# Patient Record
Sex: Male | Born: 1935
Health system: Southern US, Community
[De-identification: ages and names within clinical notes are randomized; demographics above are authoritative.]

## PROBLEM LIST (undated history)

## (undated) DIAGNOSIS — R918 Other nonspecific abnormal finding of lung field: Secondary | ICD-10-CM

## (undated) DIAGNOSIS — E785 Hyperlipidemia, unspecified: Secondary | ICD-10-CM

## (undated) DIAGNOSIS — R739 Hyperglycemia, unspecified: Secondary | ICD-10-CM

## (undated) DIAGNOSIS — K648 Other hemorrhoids: Secondary | ICD-10-CM

## (undated) DIAGNOSIS — E7800 Pure hypercholesterolemia, unspecified: Secondary | ICD-10-CM

## (undated) DIAGNOSIS — I499 Cardiac arrhythmia, unspecified: Secondary | ICD-10-CM

## (undated) DIAGNOSIS — D179 Benign lipomatous neoplasm, unspecified: Secondary | ICD-10-CM

## (undated) DIAGNOSIS — R972 Elevated prostate specific antigen [PSA]: Secondary | ICD-10-CM

## (undated) DIAGNOSIS — I10 Essential (primary) hypertension: Secondary | ICD-10-CM

## (undated) DIAGNOSIS — K635 Polyp of colon: Secondary | ICD-10-CM

## (undated) DIAGNOSIS — R7309 Other abnormal glucose: Secondary | ICD-10-CM

## (undated) DIAGNOSIS — F419 Anxiety disorder, unspecified: Secondary | ICD-10-CM

## (undated) DIAGNOSIS — N400 Enlarged prostate without lower urinary tract symptoms: Secondary | ICD-10-CM

## (undated) HISTORY — DX: Polyp of colon: K63.5

## (undated) HISTORY — DX: Anxiety disorder, unspecified: F41.9

## (undated) HISTORY — DX: Hyperglycemia, unspecified: R73.9

## (undated) HISTORY — DX: Other abnormal glucose: R73.09

## (undated) HISTORY — DX: Essential (primary) hypertension: I10

## (undated) HISTORY — DX: Benign prostatic hyperplasia without lower urinary tract symptoms: N40.0

## (undated) HISTORY — DX: Pure hypercholesterolemia, unspecified: E78.00

## (undated) HISTORY — DX: Other nonspecific abnormal finding of lung field: R91.8

## (undated) HISTORY — DX: Benign lipomatous neoplasm, unspecified: D17.9

## (undated) HISTORY — DX: Hyperlipidemia, unspecified: E78.5

## (undated) HISTORY — DX: Other hemorrhoids: K64.8

## (undated) HISTORY — PX: OTHER SURGICAL HISTORY: SHX169

## (undated) HISTORY — DX: Cardiac arrhythmia, unspecified: I49.9

## (undated) HISTORY — DX: Elevated prostate specific antigen (PSA): R97.20

---

## 2002-05-01 ENCOUNTER — Encounter (INDEPENDENT_AMBULATORY_CARE_PROVIDER_SITE_OTHER): Payer: Self-pay | Admitting: *Deleted

## 2002-05-01 ENCOUNTER — Ambulatory Visit (HOSPITAL_COMMUNITY): Admission: RE | Admit: 2002-05-01 | Discharge: 2002-05-01 | Payer: Self-pay | Admitting: *Deleted

## 2004-10-20 ENCOUNTER — Encounter (INDEPENDENT_AMBULATORY_CARE_PROVIDER_SITE_OTHER): Payer: Self-pay | Admitting: *Deleted

## 2004-10-20 ENCOUNTER — Ambulatory Visit (HOSPITAL_COMMUNITY): Admission: RE | Admit: 2004-10-20 | Discharge: 2004-10-20 | Payer: Self-pay | Admitting: *Deleted

## 2007-10-24 ENCOUNTER — Encounter: Admission: RE | Admit: 2007-10-24 | Discharge: 2007-10-24 | Payer: Self-pay | Admitting: Internal Medicine

## 2007-11-01 ENCOUNTER — Encounter: Admission: RE | Admit: 2007-11-01 | Discharge: 2007-11-01 | Payer: Self-pay | Admitting: Internal Medicine

## 2007-11-09 ENCOUNTER — Encounter: Admission: RE | Admit: 2007-11-09 | Discharge: 2007-11-09 | Payer: Self-pay | Admitting: Internal Medicine

## 2007-11-23 ENCOUNTER — Encounter (INDEPENDENT_AMBULATORY_CARE_PROVIDER_SITE_OTHER): Payer: Self-pay | Admitting: Diagnostic Radiology

## 2007-11-23 ENCOUNTER — Other Ambulatory Visit: Admission: RE | Admit: 2007-11-23 | Discharge: 2007-11-23 | Payer: Self-pay | Admitting: Diagnostic Radiology

## 2007-11-23 ENCOUNTER — Encounter: Admission: RE | Admit: 2007-11-23 | Discharge: 2007-11-23 | Payer: Self-pay | Admitting: Internal Medicine

## 2008-02-21 ENCOUNTER — Encounter: Admission: RE | Admit: 2008-02-21 | Discharge: 2008-02-21 | Payer: Self-pay | Admitting: Internal Medicine

## 2008-11-27 ENCOUNTER — Encounter: Admission: RE | Admit: 2008-11-27 | Discharge: 2008-11-27 | Payer: Self-pay | Admitting: Internal Medicine

## 2009-11-28 ENCOUNTER — Encounter: Admission: RE | Admit: 2009-11-28 | Discharge: 2009-11-28 | Payer: Self-pay | Admitting: Internal Medicine

## 2010-05-14 ENCOUNTER — Encounter (INDEPENDENT_AMBULATORY_CARE_PROVIDER_SITE_OTHER): Payer: Self-pay | Admitting: *Deleted

## 2010-06-22 ENCOUNTER — Encounter (INDEPENDENT_AMBULATORY_CARE_PROVIDER_SITE_OTHER): Payer: Self-pay | Admitting: *Deleted

## 2010-06-24 ENCOUNTER — Ambulatory Visit: Payer: Self-pay | Admitting: Gastroenterology

## 2010-07-08 ENCOUNTER — Ambulatory Visit: Payer: Self-pay | Admitting: Gastroenterology

## 2010-07-09 ENCOUNTER — Telehealth: Payer: Self-pay | Admitting: Gastroenterology

## 2010-11-10 NOTE — Procedures (Signed)
Summary: Colonoscopy  Patient: Sean Lindsey Note: All result statuses are Final unless otherwise noted.  Tests: (1) Colonoscopy (COL)   COL Colonoscopy           DONE     [facility]     [address]     [place], [place]  [address]           COLONOSCOPY PROCEDURE REPORT           PATIENT:  Sean Lindsey, Sean Lindsey  MR#:  [id number]     BIRTHDATE:  May 03, 1930, 80 yrs. old  GENDER:  male           ENDOSCOPIST:  Barbette Hair. Arlyce Dice, MD     Referred by:           PROCEDURE DATE:  07/08/2010     PROCEDURE:  Diagnostic Colonoscopy     ASA Lindsey:  [name]     INDICATIONS:  1) screening  2) history of pre-cancerous     (adenomatous) colon polyps Adenomatous polyp 2003     Hyperplastic polyp 2006           MEDICATIONS:   Fentanyl 75 mcg IV, Versed 6 mg IV           DESCRIPTION OF PROCEDURE:   After the risks benefits and     alternatives of the procedure were thoroughly explained, informed     consent was obtained.  Digital rectal exam was performed and     revealed no abnormalities.   The [id number] endoscope was     introduced through the anus and advanced to the cecum, which was     identified by both the appendix and ileocecal valve, without     limitations.  The quality of the prep was excellent, using     MiraLax.  The instrument was then slowly withdrawn as the colon     was fully examined.     <<PROCEDUREIMAGES>>           FINDINGS:  A lipoma was found in the ascending colon (see image6).     This was otherwise a normal examination of the colon (see image1,     image3, image4, image8, image10, image11, image12, image14,     image17, and image19).   Retroflexed views in the rectum revealed     no abnormalities.    The time to cecum =  3.50  minutes. The scope     was then withdrawn (time =  6.25  min) from the patient and the     procedure completed.           COMPLICATIONS:  None           ENDOSCOPIC IMPRESSION:     1) Lipoma in the ascending colon     2) Otherwise  normal examination     RECOMMENDATIONS:     1) Colonoscopy in 10 years           REPEAT EXAM:  In 10 year(s) for Colonoscopy.           ______________________________     Barbette Hair. Arlyce Dice, MD           CC: Pearson Grippe, MD           n.     Rosalie Doctor:   Barbette Hair. Kaplan at 07/08/2010 10:49 AM           Laruth Bouchard, [id number]  Note: An exclamation mark (!) indicates a result that  was not dispersed into the flowsheet. Document Creation Date: 07/08/2010 10:49 AM _______________________________________________________________________  (1) Order result status: Final Collection or observation date-time: 07/08/2010 10:41 Requested date-time:  Receipt date-time:  Reported date-time:  Referring Physician:   Ordering Physician: Melvia Heaps [phone]) Specimen Source:  Source: Launa Grill Order Number: [id number] Lab site:   Appended Document: Colonoscopy    Clinical Lists Changes  Observations: Added new observation of COLONNXTDUE: 06/2020 (07/08/2010 11:17)

## 2010-11-10 NOTE — Letter (Signed)
Summary: [facility] Instructions  [facility]  [address] [place], [place] [address]   Phone: [phone]  Fax: [phone]       Sean Lindsey    1930/03/01    MRN: [id number]        Procedure Day Dorna Bloom: Wednesday 07-08-10     Arrival Time: 9:00 a.m.     Procedure Time: 10:00 a.m.     Location of Procedure:                    _x _  [facility] (4th Floor)                        PREPARATION FOR COLONOSCOPY WITH MOVIPREP   Starting 5 days prior to your procedure  07-03-10 do not eat nuts, seeds, popcorn, corn, beans, peas,  salads, or any raw vegetables.  Do not take any fiber supplements (e.g. Metamucil, Citrucel, and Benefiber).  THE DAY BEFORE YOUR PROCEDURE         DATE:  07-07-10   DAY:  Tuesday  1.  Drink clear liquids the entire day-NO SOLID FOOD  2.  Do not drink anything colored red or purple.  Avoid juices with pulp.  No orange juice.  3.  Drink at least 64 oz. (8 glasses) of fluid/clear liquids during the day to prevent dehydration and help the prep work efficiently.  CLEAR LIQUIDS INCLUDE: Water Jello Ice Popsicles Tea (sugar ok, no milk/cream) Powdered fruit flavored drinks Coffee (sugar ok, no milk/cream) Gatorade Juice: apple, white grape, white cranberry  Lemonade Clear bullion, consomm, broth Carbonated beverages (any kind) Strained chicken noodle soup Hard Candy                             4.  In the morning, mix first dose of MoviPrep solution:    Empty 1 Pouch A and 1 Pouch B into the disposable container    Add lukewarm drinking water to the top line of the container. Mix to dissolve    Refrigerate (mixed solution should be used within 24 hrs)  5.  Begin drinking the prep at 5:00 p.m. The MoviPrep container is divided by 4 marks.   Every 15 minutes drink the solution down to the next mark (approximately 8 oz) until the full liter is complete.   6.  Follow completed prep with 16 oz of clear liquid of your choice  (Nothing red or purple).  Continue to drink clear liquids until bedtime.  7.  Before going to bed, mix second dose of MoviPrep solution:    Empty 1 Pouch A and 1 Pouch B into the disposable container    Add lukewarm drinking water to the top line of the container. Mix to dissolve    Refrigerate  THE DAY OF YOUR PROCEDURE      DATE:  07-08-10  DAY:  Wednesday  Beginning at  5:00 a.m. (5 hours before procedure):         1. Every 15 minutes, drink the solution down to the next mark (approx 8 oz) until the full liter is complete.  2. Follow completed prep with 16 oz. of clear liquid of your choice.    3. You may drink clear liquids until  8:00 a.m.  (2 HOURS BEFORE PROCEDURE).   MEDICATION INSTRUCTIONS  Unless otherwise instructed, you should take regular prescription medications with a small sip of  water   as early as possible the morning of your procedure.  Additional medication instructions:  DO NOT TAKE BENICAR/HCT ON THE MORNING OF THE PROCEDURE.          OTHER INSTRUCTIONS  You will need a responsible adult at least 75 years of age to accompany you and drive you home.   This person must remain in the waiting room during your procedure.  Wear loose fitting clothing that is easily removed.  Leave jewelry and other valuables at home.  However, you may wish to bring a book to read or  an iPod/MP3 player to listen to music as you wait for your procedure to start.  Remove all body piercing jewelry and leave at home.  Total time from sign-in until discharge is approximately 2-3 hours.  You should go home directly after your procedure and rest.  You can resume normal activities the  day after your procedure.  The day of your procedure you should not:   Drive   Make legal decisions   Operate machinery   Drink alcohol   Return to work  You will receive specific instructions about eating, activities and medications before you leave.    The above instructions  have been reviewed and explained to me by   Durwin Glaze RN  June 24, 2010 9:25 AM    I fully understand and can verbalize these instructions _____________________________ Date _________

## 2010-11-10 NOTE — Miscellaneous (Signed)
Summary: [facility]  Clinical Lists Changes  Medications: Added new medication of MOVIPREP 100 GM  SOLR (PEG-KCL-NACL-NASULF-NA ASC-C) As per prep instructions. - Signed Rx of MOVIPREP 100 GM  SOLR (PEG-KCL-NACL-NASULF-NA ASC-C) As per prep instructions.;  #1 x 0;  Signed;  Entered by: Durwin Glaze RN;  Authorized by: Louis Meckel MD;  Method used: Electronically to [facility] [address] [place], [place]  [id number], Ph: [phone] or [phone], Fax: [phone] Observations: Added new observation of NKA: T (06/24/2010 9:06)    Prescriptions: MOVIPREP 100 GM  SOLR (PEG-KCL-NACL-NASULF-NA ASC-C) As per prep instructions.  #1 x 0   Entered by:   Durwin Glaze RN   Authorized by:   Louis Meckel MD   Signed by:   Durwin Glaze RN on 06/24/2010   Method used:   Electronically to        [facility] (retail)       [address]       [place], [place]  [id number]       Ph: [phone] or [phone]       Fax: [phone]   RxID:   [id number]

## 2010-11-10 NOTE — Progress Notes (Signed)
Summary: report questions.  Phone Note Call from Patient Call back at Home Phone [phone]   Caller: Patient Call For: [facility] NURSE -Dr. Arlyce Dice Summary of Call: he has questions about his procedure and needs to know what the Lipoma is and why he dont need to follow up for 10 years. He also has questions about specific images on his preport. Initial call taken by: Harlow Mares CMA Duncan Dull),  July 09, 2010 9:44 AM  Follow-up for Phone Call        Spoke with patient at length about colon pictures.  He had questions about his lipoma and questions about when to return for another colonoscopy.   He is a Systems developer. Follow-up by: Clide Cliff RN,  July 09, 2010 10:00 AM

## 2010-11-10 NOTE — Letter (Signed)
Summary: Previsit letter  [facility]  [address] [place], [place] [address]   Phone: [phone]  Fax: [phone]       05/14/2010 MRN: [id number]  [facility] [address] [place], [place]  [address]  Dear Mr. Sean Lindsey,  Welcome to the Gastroenterology Division at [facility].    You are scheduled to see a nurse for your pre-procedure visit on 06-24-10 at 9:00a.m. on the 3rd floor at [facility], [address]  We ask that you try to arrive at our office 15 minutes prior to your appointment time to allow for check-in.  Your nurse visit will consist of discussing your medical and surgical history, your immediate family medical history, and your medications.    Please bring a complete list of all your medications or, if you prefer, bring the medication bottles and we will list them.  We will need to be aware of both prescribed and over the counter drugs.  We will need to know exact dosage information as well.  If you are on blood thinners (Coumadin, Plavix, Aggrenox, Ticlid, etc.) please call our office today/prior to your appointment, as we need to consult with your physician about holding your medication.   Please be prepared to read and sign documents such as consent forms, a financial agreement, and acknowledgement forms.  If necessary, and with your consent, a friend or relative is welcome to sit-in on the nurse visit with you.  Please bring your insurance card so that we may make a copy of it.  If your insurance requires a referral to see a specialist, please bring your referral form from your primary care physician.  No co-pay is required for this nurse visit.     If you cannot keep your appointment, please call [phone] to cancel or reschedule prior to your appointment date.  This allows [place] the opportunity to schedule an appointment for another patient in need of care.    Thank you for choosing [facility] for your medical needs.   We appreciate the opportunity to care for you.  Please visit [place] at our website  to learn more about our practice.                     Sincerely.                                                                                                                   The Gastroenterology Division

## 2010-11-28 DIAGNOSIS — R918 Other nonspecific abnormal finding of lung field: Secondary | ICD-10-CM

## 2010-11-28 HISTORY — DX: Other nonspecific abnormal finding of lung field: R91.8

## 2011-02-26 NOTE — Procedures (Signed)
[place]  Patient:    Sean Lindsey, Sean Lindsey Visit Number: [id number] MRN: [id number]          Service Type: END Location: ENDO Attending Physician:  Sabino Gasser Dictated by:   Sabino Gasser, M.D. Admit Date:  05/01/2002 Discharge Date: 05/01/2002                             Procedure Report  PROCEDURE:  Colonoscopy.  INDICATION:  Rectal bleeding.  ANESTHESIA:  Demerol 60 mg, Versed 6 mg.  DESCRIPTION OF PROCEDURE:  With the patient mildly sedated in the left lateral decubitus position, the Olympus videoscopic colonoscope was inserted in the rectum and passed under direct vision to the cecum, identified by the ileocecal valve and appendiceal orifice, both of which were photographed. Adjacent to the ileocecal valve was a lipoma, which was smooth and was photographed, probably 1-2 cm in size, and near this was a fold that was slightly thickened.  I could not tell if this was polypoid tissue, but we photographed it and biopsied using hot biopsy forceps technique, setting of 20-20 blended current.  The colonoscope was then withdrawn all the way, taking circumferential views of the remaining colonic mucosa, stopping in the rectum, which appeared normal on direct and showed hemorrhoids on retroflex view.  The endoscope was straightened and withdrawn.  The patients vital signs and pulse oximetry remained stable.  The patient tolerated the procedure well without apparent complications.  FINDINGS: 1. Large internal hemorrhoids. 2. Lipoma of cecum. 3. Small polyp of ascending colon.  PLAN:  Await biopsy report.  The patient will call me for results and follow up as an outpatient. Dictated by:   Sabino Gasser, M.D. Attending Physician:  Sabino Gasser DD:  05/01/02 TD:  05/04/02 Job: [id number]

## 2011-02-26 NOTE — Op Note (Signed)
NAMELIVIO, LEDWITH                ACCOUNT NO.:  [id number]   MEDICAL RECORD NO.:  [id number]          PATIENT TYPE:  AMB   LOCATION:  ENDO                         FACILITY:  [facility]   PHYSICIAN:  Georgiana Spinner, M.D.    DATE OF BIRTH:  1930/08/19   DATE OF PROCEDURE:  10/20/2004  DATE OF DISCHARGE:                                 OPERATIVE REPORT   PROCEDURE:  Colonoscopy with biopsy.   INDICATIONS:  Colon polyp.   ANESTHESIA:  1.  Demerol 60 mg.  2.  Versed 6 mg.   DESCRIPTION OF PROCEDURE:  With patient mildly sedated in the left lateral  decubitus position, the Olympus videoscopic colonoscope was inserted in the  rectum after a normal rectal exam and passed under direct vision to the  cecum, identified by ileocecal valve and appendiceal orifice, both of which  were photographed.  There was a lipoma adjacent to the ileocecal valve, and  this was photographed only.  Subsequently the endoscope was slowly  withdrawn, taking circumferential views of the colonic mucosa, stopping in  the rectosigmoid at approximately 20 cm from the anal verge where a small  polyp was seen, photographed, and removed using hot biopsy forceps technique  setting of 20/200 blended current.  The endoscope was pulled back to the  rectum which appeared normal on direct and showed hemorrhoids on retroflexed  view.  The endoscope was straightened, withdrawn.  The patient's vital signs  and pulse oximeter remained stable.  The patient tolerated the procedure  well without apparent complications.   FINDINGS:  1.  Internal hemorrhoids.  2.  Polyp of rectosigmoid.  3.  Lipoma in the ascending colon.  4.  One fold removed from the ileocecal valve.  5.  Otherwise, an unremarkable exam.   PLAN:  Await biopsy report.  The patient will call me for results and follow  up with me as an outpatient.       GMO/MEDQ  D:  10/20/2004  T:  10/20/2004  Job:  [id number]

## 2012-03-01 DIAGNOSIS — I10 Essential (primary) hypertension: Secondary | ICD-10-CM | POA: Diagnosis not present

## 2012-03-01 DIAGNOSIS — E7800 Pure hypercholesterolemia, unspecified: Secondary | ICD-10-CM | POA: Diagnosis not present

## 2012-03-01 DIAGNOSIS — R7309 Other abnormal glucose: Secondary | ICD-10-CM | POA: Diagnosis not present

## 2012-03-08 DIAGNOSIS — Z0000 Encounter for general adult medical examination without abnormal findings: Secondary | ICD-10-CM | POA: Diagnosis not present

## 2012-03-08 DIAGNOSIS — E7800 Pure hypercholesterolemia, unspecified: Secondary | ICD-10-CM | POA: Diagnosis not present

## 2012-03-08 DIAGNOSIS — I10 Essential (primary) hypertension: Secondary | ICD-10-CM | POA: Diagnosis not present

## 2012-03-08 DIAGNOSIS — R7309 Other abnormal glucose: Secondary | ICD-10-CM | POA: Diagnosis not present

## 2012-05-03 DIAGNOSIS — R972 Elevated prostate specific antigen [PSA]: Secondary | ICD-10-CM | POA: Diagnosis not present

## 2012-05-03 DIAGNOSIS — N138 Other obstructive and reflux uropathy: Secondary | ICD-10-CM | POA: Diagnosis not present

## 2012-05-03 DIAGNOSIS — N401 Enlarged prostate with lower urinary tract symptoms: Secondary | ICD-10-CM | POA: Diagnosis not present

## 2012-05-10 DIAGNOSIS — N529 Male erectile dysfunction, unspecified: Secondary | ICD-10-CM | POA: Diagnosis not present

## 2012-05-10 DIAGNOSIS — N401 Enlarged prostate with lower urinary tract symptoms: Secondary | ICD-10-CM | POA: Diagnosis not present

## 2012-05-10 DIAGNOSIS — N138 Other obstructive and reflux uropathy: Secondary | ICD-10-CM | POA: Diagnosis not present

## 2012-08-02 DIAGNOSIS — R109 Unspecified abdominal pain: Secondary | ICD-10-CM | POA: Diagnosis not present

## 2012-08-03 DIAGNOSIS — R109 Unspecified abdominal pain: Secondary | ICD-10-CM | POA: Diagnosis not present

## 2012-08-04 ENCOUNTER — Telehealth: Payer: Self-pay | Admitting: Gastroenterology

## 2012-08-04 NOTE — Telephone Encounter (Signed)
Pt scheduled to see Willette Cluster NP 08/08/12@2pm . Dennie Bible will fax office notes, already have labs. Dennie Bible will notify pt of appt date and time.

## 2012-08-07 ENCOUNTER — Encounter: Payer: Self-pay | Admitting: *Deleted

## 2012-08-08 ENCOUNTER — Encounter: Payer: Self-pay | Admitting: Nurse Practitioner

## 2012-08-08 ENCOUNTER — Ambulatory Visit (INDEPENDENT_AMBULATORY_CARE_PROVIDER_SITE_OTHER): Payer: Medicare Other | Admitting: Nurse Practitioner

## 2012-08-08 VITALS — BP 122/80 | HR 68

## 2012-08-08 DIAGNOSIS — K5900 Constipation, unspecified: Secondary | ICD-10-CM

## 2012-08-08 DIAGNOSIS — R1032 Left lower quadrant pain: Secondary | ICD-10-CM | POA: Diagnosis not present

## 2012-08-08 DIAGNOSIS — R1031 Right lower quadrant pain: Secondary | ICD-10-CM

## 2012-08-08 DIAGNOSIS — R143 Flatulence: Secondary | ICD-10-CM | POA: Diagnosis not present

## 2012-08-08 DIAGNOSIS — R142 Eructation: Secondary | ICD-10-CM | POA: Diagnosis not present

## 2012-08-08 DIAGNOSIS — R141 Gas pain: Secondary | ICD-10-CM

## 2012-08-08 DIAGNOSIS — IMO0001 Reserved for inherently not codable concepts without codable children: Secondary | ICD-10-CM | POA: Insufficient documentation

## 2012-08-08 MED ORDER — PSYLLIUM 28 % PO PACK: 1.0000 | PACK | Freq: Two times a day (BID) | ORAL | Status: DC

## 2012-08-08 MED ORDER — ALIGN PO CAPS: 1.0000 | ORAL_CAPSULE | Freq: Every day | ORAL | Status: DC

## 2012-08-08 NOTE — Progress Notes (Signed)
08/08/2012 Sean Lindsey [id number] 1929/12/19   History of Present Illness:  Patient is an 78 year old male known to Dr. Arlyce Dice for history of adenomatous colon polyps. His last surveillance colonoscopy was September 2011 at which time a lipoma was seen in the ascending colon. No other findings, repeat colonoscopy was recommended at 10 year mark.  Patient referred here by PCP for evaluation of lower abdominal discomfort and constipation. Patient gives a history of occasional constipation throughout his life, especially when driving on long trips and or eating seafood. Several weeks ago patient drove to [place] and back. While in [place] he developed problems with constipation associated with mild diffuse lower abdominal discomfort  , it increased gas. Patient saw PCP on 08/03/12 but by that time bowel movements had improved after initiation of stool softeners. Patient was still having some mild lower abdominal discomfort, he was referred here. Patient is trying to eat more fruits and vegetables, exercise seems to help regulate his bowels. He has some residual diffuse lower abdominal discomfort but his main concern seems to be the need to strain for bowel movements. He also continues to have some problems with excessive gas buildup. His weight is stable. No nausea or vomiting. Appetite is good. No urinary hesitancy or other urinary symptoms.  Patient asked about possibility that the colon lipoma has enlarged and is causing a bowel blockage.    Current Medications, Allergies, Past Medical History, Past Surgical History, Family History and Social History were reviewed in [employer] record.   Physical Exam: General: Well developed , white male in no acute distress Head: Normocephalic and atraumatic Eyes:  sclerae anicteric, conjunctiva pink  Ears: Normal auditory acuity Lungs: Clear throughout to auscultation Heart: Regular rate and rhythm Abdomen: Soft,  non distended,  minimal tenderness in bilateral lower quadrants.No masses, no hepatomegaly. Normal bowel sounds Musculoskeletal: Symmetrical with no gross deformities  Extremities: No edema  Neurological: Alert oriented x 4, grossly nonfocal Psychological:  Alert and cooperative. Normal mood and affect  Assessment and Recommendations:  1. Constipation / gas / mild lower abdominal discomfort. He is having to strain more than usual with defecation. His abdominal exam is not overly concerning. Will begin daily Metamucil. If no improvement in 7-10 days patient will call the office at which time we will start daily MiraLax. Patient will followup with me in 3-4 weeks' time. He knows to call the office for increasing abdominal pain, any abdominal distention, nausea or vomiting.  2. colonic lipoma(colonoscopy September 2011). Reassured patient that the lipoma almost certainly is not related in any way to #1 . He was worried about lipoma causing a blockage.

## 2012-08-08 NOTE — Patient Instructions (Addendum)
Try Metamucil once daily (coupon given). If inadequate results in 10 days, call the office we will begin daily MiraLax at that point.   Trial of probiotics (Align)  for 14 days,samples given  If you develop abdominal swelling, worsening abdominal pain, nausea or vomiting call the office as soon as possible  Follow up with me Willette Cluster ACNP  in 3-4 weeks. Made you an appointment for 09-06-2012 at 10:00 AM.

## 2012-08-09 ENCOUNTER — Encounter: Payer: Self-pay | Admitting: Nurse Practitioner

## 2012-08-09 ENCOUNTER — Telehealth: Payer: Self-pay | Admitting: Gastroenterology

## 2012-08-09 DIAGNOSIS — R1031 Right lower quadrant pain: Secondary | ICD-10-CM | POA: Insufficient documentation

## 2012-08-09 DIAGNOSIS — R1032 Left lower quadrant pain: Secondary | ICD-10-CM | POA: Insufficient documentation

## 2012-08-09 NOTE — Telephone Encounter (Signed)
Left message for pt to call back  °

## 2012-08-10 NOTE — Telephone Encounter (Signed)
Pt states he thinks the metamucil is making him more constipated. Pt wanted to know if it is ok for him to go ahead and start on the miralax. Let pt know it is ok for him to go ahead and take the miralax. Pt verbalized understanding.

## 2012-08-10 NOTE — Progress Notes (Signed)
Reviewed and agree with management. Amri Lien D. Elson Ulbrich, M.D., FACG  

## 2012-08-23 DIAGNOSIS — Z23 Encounter for immunization: Secondary | ICD-10-CM | POA: Diagnosis not present

## 2012-08-31 DIAGNOSIS — Z125 Encounter for screening for malignant neoplasm of prostate: Secondary | ICD-10-CM | POA: Diagnosis not present

## 2012-08-31 DIAGNOSIS — R7309 Other abnormal glucose: Secondary | ICD-10-CM | POA: Diagnosis not present

## 2012-08-31 DIAGNOSIS — I10 Essential (primary) hypertension: Secondary | ICD-10-CM | POA: Diagnosis not present

## 2012-09-06 ENCOUNTER — Ambulatory Visit: Payer: Medicare Other | Admitting: Nurse Practitioner

## 2012-09-06 DIAGNOSIS — R7309 Other abnormal glucose: Secondary | ICD-10-CM | POA: Diagnosis not present

## 2012-09-06 DIAGNOSIS — E7800 Pure hypercholesterolemia, unspecified: Secondary | ICD-10-CM | POA: Diagnosis not present

## 2012-09-06 DIAGNOSIS — N400 Enlarged prostate without lower urinary tract symptoms: Secondary | ICD-10-CM | POA: Diagnosis not present

## 2012-09-06 DIAGNOSIS — I10 Essential (primary) hypertension: Secondary | ICD-10-CM | POA: Diagnosis not present

## 2012-09-12 ENCOUNTER — Encounter: Payer: Self-pay | Admitting: Gastroenterology

## 2012-09-12 ENCOUNTER — Ambulatory Visit (INDEPENDENT_AMBULATORY_CARE_PROVIDER_SITE_OTHER): Payer: Medicare Other | Admitting: Gastroenterology

## 2012-09-12 VITALS — BP 132/60 | HR 64 | Ht 71.0 in | Wt 198.4 lb

## 2012-09-12 DIAGNOSIS — K5900 Constipation, unspecified: Secondary | ICD-10-CM | POA: Diagnosis not present

## 2012-09-12 NOTE — Progress Notes (Signed)
History of Present Illness:  Sean Lindsey returns for followup of constipation. He initially took Metamucil, then MiraLax and finally align for constipation. Ultimately he has stopped all his medications and is moving his bowels regularly. He feels that he is under a great deal of strain related to the death of his brother which coincided with his constipation. He currently has no GI complaints.    Review of Systems: Pertinent positive and negative review of systems were noted in the above HPI section. All other review of systems were otherwise negative.    Current Medications, Allergies, Past Medical History, Past Surgical History, Family History and Social History were reviewed in [employer] electronic medical record  Vital signs were reviewed in today's medical record. Physical Exam: General: Well developed , well nourished, no acute distress

## 2012-09-12 NOTE — Assessment & Plan Note (Signed)
Functional constipation has resolved. Plan no further workup. He will followup as needed.

## 2012-09-12 NOTE — Patient Instructions (Addendum)
Follow up as needed

## 2013-01-01 DIAGNOSIS — R7309 Other abnormal glucose: Secondary | ICD-10-CM | POA: Diagnosis not present

## 2013-01-01 DIAGNOSIS — I10 Essential (primary) hypertension: Secondary | ICD-10-CM | POA: Diagnosis not present

## 2013-01-04 DIAGNOSIS — R7309 Other abnormal glucose: Secondary | ICD-10-CM | POA: Diagnosis not present

## 2013-01-04 DIAGNOSIS — E7800 Pure hypercholesterolemia, unspecified: Secondary | ICD-10-CM | POA: Diagnosis not present

## 2013-01-04 DIAGNOSIS — I10 Essential (primary) hypertension: Secondary | ICD-10-CM | POA: Diagnosis not present

## 2013-02-12 ENCOUNTER — Ambulatory Visit (INDEPENDENT_AMBULATORY_CARE_PROVIDER_SITE_OTHER): Payer: Medicare Other | Admitting: Family Medicine

## 2013-02-12 VITALS — BP 120/60 | HR 72 | Temp 98.1°F | Resp 16 | Ht 72.0 in | Wt 198.0 lb

## 2013-02-12 DIAGNOSIS — L600 Ingrowing nail: Secondary | ICD-10-CM

## 2013-02-12 DIAGNOSIS — M79609 Pain in unspecified limb: Secondary | ICD-10-CM | POA: Diagnosis not present

## 2013-02-12 NOTE — Progress Notes (Signed)
Procedure Note: Verbal consent obtained.  Local anesthesia with 1:1 ratio 2% plain lidocaine with 0.5% marcaine - 2cc as partial digital block, additional 1cc added locally during procedure.  Betadine prep.  Sterile technique employed.  Lateral edge of great toe nail lifted without difficulty and removed in total.  Proximal aspect of the nail bed explored revealing no nail remnants.  Xeroform dressing applied.  Wound care discussed with patient.  Pt tolerated very well

## 2013-02-12 NOTE — Progress Notes (Signed)
[facility] [address] [place] [address] [phone]  Date:  02/12/2013   Name:  Sean Lindsey   DOB:  1930-07-26   MRN:  [id number]  PCP:  Pearson Grippe, MD    Chief Complaint: Nail Problem   History of Present Illness:  Sean Lindsey is a 79 y.o. very pleasant male patient who presents with the following:  He notes pain in his right great toe for the last couple of weeks. He has tried some OTC ointments.  The pain has waxed and waned.  It has not drained any material.  He soaked it some this morning.  No fever. He thinks he has an ingrown nail and would like to have it treated to relieve his pain.  He has never had this in the past.      Patient Active Problem List   Diagnosis Date Noted  . Bilateral lower abdominal discomfort 08/09/2012  . Gas 08/08/2012    Past Medical History  Diagnosis Date  . Hypertension     Benign  . Hypercholesterolemia   . Other abnormal glucose   . Hyperlipidemia   . Hyperglycemia   . BPH (benign prostatic hyperplasia)   . Lung nodules 11/28/2010    History of  . PSA elevation   . Lipoma     Near cecum  . Internal hemorrhoids   . Hyperplastic colon polyp     Past Surgical History  Procedure Laterality Date  . Left thyroid nodule      FNA    History  Substance Use Topics  . Smoking status: Former Smoker    Quit date: 10/11/1984  . Smokeless tobacco: Never Used  . Alcohol Use: Yes     Comment: occ. wine    Family History  Problem Relation Age of Onset  . Kidney failure Father     Deceased  . Heart disease Sister   . Liver cancer Brother   . Leukemia Brother   . Lymphoma Sister   . Lymphoma Brother   . Gastric cancer Brother   . Gastric cancer Mother     Allergies  Allergen Reactions  . Flomax (Tamsulosin Hcl) Other (See Comments)    Fainting  . Uroxatral (Alfuzosin) Other (See Comments)    Made patient feel bad.    Medication list has been reviewed and updated.  Current Outpatient  Prescriptions on File Prior to Visit  Medication Sig Dispense Refill  . ALPRAZolam (XANAX) 0.25 MG tablet Take 0.25 mg by mouth 3 (three) times daily as needed.      Marland Kitchen amLODipine (NORVASC) 10 MG tablet Take 10 mg by mouth daily.      Marland Kitchen aspirin 81 MG tablet Take 81 mg by mouth daily.      Marland Kitchen atorvastatin (LIPITOR) 20 MG tablet Take 20 mg by mouth daily.      . Cholecalciferol (VITAMIN D) 2000 UNITS CAPS Take by mouth. Take 1 cap once daily      . dutasteride (AVODART) 0.5 MG capsule Take 0.5 mg by mouth daily.      Marland Kitchen olmesartan-hydrochlorothiazide (BENICAR HCT) 40-25 MG per tablet Take 1 tablet by mouth daily.      . silodosin (RAPAFLO) 8 MG CAPS capsule Take 8 mg by mouth daily with breakfast.       No current facility-administered medications on file prior to visit.    Review of Systems:  As per HPI- otherwise negative. No fever  Physical Examination: Filed Vitals:  02/12/13 1445  BP: 120/60  Pulse: 72  Temp: 98.1 F (36.7 C)  Resp: 16   Filed Vitals:   02/12/13 1445  Height: 6' (1.829 m)  Weight: 198 lb (89.812 kg)   Body mass index is 26.85 kg/(m^2). Ideal Body Weight: Weight in (lb) to have BMI = 25: 183.9   GEN: WDWN, NAD, Non-toxic, Alert & Oriented x 3 HEENT: Atraumatic, Normocephalic.  Ears and Nose: No external deformity. CV: RRR, no MRG PULM: CTA bilaterally  EXTR: No clubbing/cyanosis/edema NEURO: Normal gait.  PSYCH: Normally interactive. Conversant. Not depressed or anxious appearing.  Calm demeanor.  Ingrown right great toe- nail.  Tender and red at the lateral nail border.  There is some fungal involvement of the nail which he states is long- standing.  He prefers not to remove the entire nail unless necessary.    Assessment and Plan: Ingrown right big toenail  See procedure note by Ms. Debbra Riding, PA-C.  Successful wedge resection without any pus or evidence of infection.  Wound care discussed.  He will RTC as needed   Signed Abbe Amsterdam, MD

## 2013-02-12 NOTE — Patient Instructions (Addendum)
INGROWN TOENAIL . Keep area clean, dry and bandaged for 24 hours. . After 24 hours, remove outer bandage and leave yellow gauze in place. . Soak toe/foot in warm soapy water for 5-10 minutes, once daily for 5 days. Rebandage toe after each cleaning. . Continue soaks until yellow gauze falls off. . Notify the office if you experience any of the following signs of infection: Swelling, redness, pus drainage, streaking, fever > 101.0 F   

## 2013-03-07 DIAGNOSIS — N138 Other obstructive and reflux uropathy: Secondary | ICD-10-CM | POA: Diagnosis not present

## 2013-03-07 DIAGNOSIS — N401 Enlarged prostate with lower urinary tract symptoms: Secondary | ICD-10-CM | POA: Diagnosis not present

## 2013-03-08 DIAGNOSIS — R972 Elevated prostate specific antigen [PSA]: Secondary | ICD-10-CM | POA: Diagnosis not present

## 2013-03-08 DIAGNOSIS — N401 Enlarged prostate with lower urinary tract symptoms: Secondary | ICD-10-CM | POA: Diagnosis not present

## 2013-03-08 DIAGNOSIS — N138 Other obstructive and reflux uropathy: Secondary | ICD-10-CM | POA: Diagnosis not present

## 2013-06-28 DIAGNOSIS — I10 Essential (primary) hypertension: Secondary | ICD-10-CM | POA: Diagnosis not present

## 2013-06-28 DIAGNOSIS — R7309 Other abnormal glucose: Secondary | ICD-10-CM | POA: Diagnosis not present

## 2013-07-05 DIAGNOSIS — I10 Essential (primary) hypertension: Secondary | ICD-10-CM | POA: Diagnosis not present

## 2013-07-05 DIAGNOSIS — E7800 Pure hypercholesterolemia, unspecified: Secondary | ICD-10-CM | POA: Diagnosis not present

## 2013-07-05 DIAGNOSIS — R7309 Other abnormal glucose: Secondary | ICD-10-CM | POA: Diagnosis not present

## 2013-07-05 DIAGNOSIS — Z125 Encounter for screening for malignant neoplasm of prostate: Secondary | ICD-10-CM | POA: Diagnosis not present

## 2013-08-02 DIAGNOSIS — Z23 Encounter for immunization: Secondary | ICD-10-CM | POA: Diagnosis not present

## 2014-01-09 DIAGNOSIS — Z125 Encounter for screening for malignant neoplasm of prostate: Secondary | ICD-10-CM | POA: Diagnosis not present

## 2014-01-09 DIAGNOSIS — R7309 Other abnormal glucose: Secondary | ICD-10-CM | POA: Diagnosis not present

## 2014-01-09 DIAGNOSIS — I10 Essential (primary) hypertension: Secondary | ICD-10-CM | POA: Diagnosis not present

## 2014-01-15 DIAGNOSIS — I10 Essential (primary) hypertension: Secondary | ICD-10-CM | POA: Diagnosis not present

## 2014-01-15 DIAGNOSIS — R7309 Other abnormal glucose: Secondary | ICD-10-CM | POA: Diagnosis not present

## 2014-01-15 DIAGNOSIS — E7800 Pure hypercholesterolemia, unspecified: Secondary | ICD-10-CM | POA: Diagnosis not present

## 2014-02-08 DIAGNOSIS — M533 Sacrococcygeal disorders, not elsewhere classified: Secondary | ICD-10-CM | POA: Diagnosis not present

## 2014-02-17 ENCOUNTER — Ambulatory Visit (INDEPENDENT_AMBULATORY_CARE_PROVIDER_SITE_OTHER): Payer: Medicare Other | Admitting: Family Medicine

## 2014-02-17 VITALS — BP 126/62 | HR 68 | Temp 98.3°F | Resp 14 | Ht 69.5 in | Wt 197.4 lb

## 2014-02-17 DIAGNOSIS — R05 Cough: Secondary | ICD-10-CM | POA: Diagnosis not present

## 2014-02-17 DIAGNOSIS — J029 Acute pharyngitis, unspecified: Secondary | ICD-10-CM | POA: Diagnosis not present

## 2014-02-17 DIAGNOSIS — R059 Cough, unspecified: Secondary | ICD-10-CM | POA: Diagnosis not present

## 2014-02-17 DIAGNOSIS — J069 Acute upper respiratory infection, unspecified: Secondary | ICD-10-CM | POA: Diagnosis not present

## 2014-02-17 LAB — POCT RAPID STREP A (OFFICE): Rapid Strep A Screen: NEGATIVE

## 2014-02-17 MED ORDER — BENZONATATE 100 MG PO CAPS: 200.0000 mg | ORAL_CAPSULE | Freq: Two times a day (BID) | ORAL | Status: DC | PRN

## 2014-02-17 MED ORDER — AMOXICILLIN 500 MG PO TABS: 500.0000 mg | ORAL_TABLET | Freq: Two times a day (BID) | ORAL | Status: DC

## 2014-02-17 NOTE — Progress Notes (Signed)
Chief Complaint:  Chief Complaint  Patient presents with  . Cough    cough with light yellow sputum, sore throat this morning, PND    HPI: Sean Lindsey is a 80 y.o. male who is here for 2 day history of sore throat, mild congestion in his lungs, worse in the night than day. He may have had subjective fever. No sick contacts. Has not tried anythign for this He is very sure that this is a bacterial infection that requires an antibiotic, he does not want it to get into his lungs. He feels like it is heading that way. He denies allergy sxs, denies SOB, wheezing.  He has a son who is a Software engineer  Past Medical History  Diagnosis Date  . Hypertension     Benign  . Hypercholesterolemia   . Other abnormal glucose   . Hyperlipidemia   . Hyperglycemia   . BPH (benign prostatic hyperplasia)   . Lung nodules 11/28/2010    History of  . PSA elevation   . Lipoma     Near cecum  . Internal hemorrhoids   . Hyperplastic colon polyp   . Anxiety    Past Surgical History  Procedure Laterality Date  . Left thyroid nodule      FNA   History   Social History  . Marital Status: Married    Spouse Name: N/A    Number of Children: 2  . Years of Education: N/A   Occupational History  . Retired    Social History Main Topics  . Smoking status: Former Smoker    Quit date: 10/11/1984  . Smokeless tobacco: Never Used  . Alcohol Use: Yes     Comment: occ. wine  . Drug Use: No  . Sexual Activity: None   Other Topics Concern  . None   Social History Narrative   High risk of falls, patient has had 1 fall with injury within the last year.   Family History  Problem Relation Age of Onset  . Kidney failure Father     Deceased  . Heart disease Sister   . Liver cancer Brother   . Leukemia Brother   . Lymphoma Sister   . Lymphoma Brother   . Gastric cancer Brother   . Gastric cancer Mother    Allergies  Allergen Reactions  . Flomax [Tamsulosin Hcl] Other (See Comments)   Fainting  . Uroxatral [Alfuzosin] Other (See Comments)    Made patient feel bad.   Prior to Admission medications   Medication Sig Start Date End Date Taking? Authorizing Provider  ALPRAZolam (XANAX) 0.25 MG tablet Take 0.25 mg by mouth 3 (three) times daily as needed.   Yes Historical Provider, MD  amLODipine (NORVASC) 10 MG tablet Take 10 mg by mouth daily.   Yes Historical Provider, MD  aspirin 81 MG tablet Take 81 mg by mouth daily.   Yes Historical Provider, MD  atorvastatin (LIPITOR) 20 MG tablet Take 20 mg by mouth daily.   Yes Historical Provider, MD  Cholecalciferol (VITAMIN D) 2000 UNITS CAPS Take by mouth. Take 1 cap once daily   Yes Historical Provider, MD  dutasteride (AVODART) 0.5 MG capsule Take 0.5 mg by mouth daily.   Yes Historical Provider, MD  olmesartan-hydrochlorothiazide (BENICAR HCT) 40-25 MG per tablet Take 1 tablet by mouth daily.   Yes Historical Provider, MD  silodosin (RAPAFLO) 8 MG CAPS capsule Take 8 mg by mouth daily with breakfast.   Yes Historical  Provider, MD     ROS: The patient denies  night sweats, unintentional weight loss, chest pain, palpitations, wheezing, dyspnea on exertion, nausea, vomiting, abdominal pain, dysuria, hematuria, melena, numbness, weakness, or tingling.   All other systems have been reviewed and were otherwise negative with the exception of those mentioned in the HPI and as above.    PHYSICAL EXAM: Filed Vitals:   02/17/14 1416  BP: 126/62  Pulse: 68  Temp: 98.3 F (36.8 C)  Resp: 14   Filed Vitals:   02/17/14 1416  Height: 5' 9.5" (1.765 m)  Weight: 197 lb 6.4 oz (89.54 kg)   Body mass index is 28.74 kg/(m^2).  General: Alert, no acute distress HEENT:  Normocephalic, atraumatic, oropharynx patent. EOMI, PERRLA, TM nl, No exudates, + erythemtous throat. .  Cardiovascular:  Regular rate and rhythm, no rubs murmurs or gallops.  No Carotid bruits, radial pulse intact. No pedal edema.  Respiratory: Clear to auscultation  bilaterally.  No wheezes, rales, or rhonchi.  No cyanosis, no use of accessory musculature GI: No organomegaly, abdomen is soft and non-tender, positive bowel sounds.  No masses. Skin: No rashes. Neurologic: Facial musculature symmetric. Psychiatric: Patient is appropriate throughout our interaction. Lymphatic: No cervical lymphadenopathy Musculoskeletal: Gait intact.   LABS: Results for orders placed in visit on 02/17/14  POCT RAPID STREP A (OFFICE)      Result Value Ref Range   Rapid Strep A Screen Negative  Negative     EKG/XRAY:   Primary read interpreted by Dr. Marin Comment at [facility].   ASSESSMENT/PLAN: Encounter Diagnoses  Name Primary?  . Acute pharyngitis Yes  . URI, acute   . Cough    Throat cx pending Rx Amox Rx Tessalon perles, otc alelrgy meds if able to tolerate F/u prn We had a long discussion about abx use, he insists he has a bacterial cold. Risk and benfits of abx use given to patient , his sone is a pharmacist so he knows.   Gross sideeffects, risk and benefits, and alternatives of medications d/w patient. Patient is aware that all medications have potential sideeffects and we are unable to predict every sideeffect or drug-drug interaction that may occur.  Glenford Bayley, DO 02/17/2014 3:07 PM

## 2014-02-17 NOTE — Patient Instructions (Signed)
Sore Throat A sore throat is pain, burning, irritation, or scratchiness of the throat. There is often pain or tenderness when swallowing or talking. A sore throat may be accompanied by other symptoms, such as coughing, sneezing, fever, and swollen neck glands. A sore throat is often the first sign of another sickness, such as a cold, flu, strep throat, or mononucleosis (commonly known as mono). Most sore throats go away without medical treatment. CAUSES  The most common causes of a sore throat include:  A viral infection, such as a cold, flu, or mono.  A bacterial infection, such as strep throat, tonsillitis, or whooping cough.  Seasonal allergies.  Dryness in the air.  Irritants, such as smoke or pollution.  Gastroesophageal reflux disease (GERD). HOME CARE INSTRUCTIONS   Only take over-the-counter medicines as directed by your caregiver.  Drink enough fluids to keep your urine clear or pale yellow.  Rest as needed.  Try using throat sprays, lozenges, or sucking on hard candy to ease any pain (if older than 4 years or as directed).  Sip warm liquids, such as broth, herbal tea, or warm water with honey to relieve pain temporarily. You may also eat or drink cold or frozen liquids such as frozen ice pops.  Gargle with salt water (mix 1 tsp salt with 8 oz of water).  Do not smoke and avoid secondhand smoke.  Put a cool-mist humidifier in your bedroom at night to moisten the air. You can also turn on a hot shower and sit in the bathroom with the door closed for 5 10 minutes. SEEK IMMEDIATE MEDICAL CARE IF:  You have difficulty breathing.  You are unable to swallow fluids, soft foods, or your saliva.  You have increased swelling in the throat.  Your sore throat does not get better in 7 days.  You have nausea and vomiting.  You have a fever or persistent symptoms for more than 2 3 days.  You have a fever and your symptoms suddenly get worse. MAKE SURE YOU:   Understand  these instructions.  Will watch your condition.  Will get help right away if you are not doing well or get worse. Document Released: 11/04/2004 Document Revised: 09/13/2012 Document Reviewed: 06/04/2012 [employer] Patient Information 2014 [employer].  

## 2014-02-19 LAB — CULTURE, GROUP A STREP: Organism ID, Bacteria: NORMAL

## 2014-02-21 ENCOUNTER — Encounter: Payer: Self-pay | Admitting: Family Medicine

## 2014-04-01 DIAGNOSIS — H26499 Other secondary cataract, unspecified eye: Secondary | ICD-10-CM | POA: Diagnosis not present

## 2014-04-01 DIAGNOSIS — H43819 Vitreous degeneration, unspecified eye: Secondary | ICD-10-CM | POA: Diagnosis not present

## 2014-07-24 DIAGNOSIS — R079 Chest pain, unspecified: Secondary | ICD-10-CM | POA: Diagnosis not present

## 2014-07-25 DIAGNOSIS — R972 Elevated prostate specific antigen [PSA]: Secondary | ICD-10-CM | POA: Diagnosis not present

## 2014-07-25 DIAGNOSIS — N401 Enlarged prostate with lower urinary tract symptoms: Secondary | ICD-10-CM | POA: Diagnosis not present

## 2014-07-25 DIAGNOSIS — R7309 Other abnormal glucose: Secondary | ICD-10-CM | POA: Diagnosis not present

## 2014-07-25 DIAGNOSIS — I10 Essential (primary) hypertension: Secondary | ICD-10-CM | POA: Diagnosis not present

## 2014-07-30 DIAGNOSIS — M5431 Sciatica, right side: Secondary | ICD-10-CM | POA: Diagnosis not present

## 2014-08-07 DIAGNOSIS — R972 Elevated prostate specific antigen [PSA]: Secondary | ICD-10-CM | POA: Diagnosis not present

## 2014-08-09 DIAGNOSIS — R739 Hyperglycemia, unspecified: Secondary | ICD-10-CM | POA: Diagnosis not present

## 2014-08-09 DIAGNOSIS — R0789 Other chest pain: Secondary | ICD-10-CM | POA: Diagnosis not present

## 2014-08-09 DIAGNOSIS — I452 Bifascicular block: Secondary | ICD-10-CM | POA: Diagnosis not present

## 2014-08-09 DIAGNOSIS — I10 Essential (primary) hypertension: Secondary | ICD-10-CM | POA: Diagnosis not present

## 2014-08-16 DIAGNOSIS — E785 Hyperlipidemia, unspecified: Secondary | ICD-10-CM | POA: Diagnosis not present

## 2014-08-16 DIAGNOSIS — I10 Essential (primary) hypertension: Secondary | ICD-10-CM | POA: Diagnosis not present

## 2014-08-16 DIAGNOSIS — R0789 Other chest pain: Secondary | ICD-10-CM | POA: Diagnosis not present

## 2014-08-21 DIAGNOSIS — I10 Essential (primary) hypertension: Secondary | ICD-10-CM | POA: Diagnosis not present

## 2014-08-21 DIAGNOSIS — R739 Hyperglycemia, unspecified: Secondary | ICD-10-CM | POA: Diagnosis not present

## 2014-08-21 DIAGNOSIS — E780 Pure hypercholesterolemia: Secondary | ICD-10-CM | POA: Diagnosis not present

## 2014-08-27 DIAGNOSIS — Z23 Encounter for immunization: Secondary | ICD-10-CM | POA: Diagnosis not present

## 2015-02-18 DIAGNOSIS — I10 Essential (primary) hypertension: Secondary | ICD-10-CM | POA: Diagnosis not present

## 2015-02-18 DIAGNOSIS — R7309 Other abnormal glucose: Secondary | ICD-10-CM | POA: Diagnosis not present

## 2015-02-18 DIAGNOSIS — E780 Pure hypercholesterolemia: Secondary | ICD-10-CM | POA: Diagnosis not present

## 2015-03-04 DIAGNOSIS — E780 Pure hypercholesterolemia: Secondary | ICD-10-CM | POA: Diagnosis not present

## 2015-03-04 DIAGNOSIS — I10 Essential (primary) hypertension: Secondary | ICD-10-CM | POA: Diagnosis not present

## 2015-03-04 DIAGNOSIS — Z125 Encounter for screening for malignant neoplasm of prostate: Secondary | ICD-10-CM | POA: Diagnosis not present

## 2015-03-04 DIAGNOSIS — R739 Hyperglycemia, unspecified: Secondary | ICD-10-CM | POA: Diagnosis not present

## 2015-05-13 ENCOUNTER — Encounter: Payer: Self-pay | Admitting: Gastroenterology

## 2015-06-19 DIAGNOSIS — M533 Sacrococcygeal disorders, not elsewhere classified: Secondary | ICD-10-CM | POA: Diagnosis not present

## 2015-07-02 DIAGNOSIS — M533 Sacrococcygeal disorders, not elsewhere classified: Secondary | ICD-10-CM | POA: Diagnosis not present

## 2015-08-06 DIAGNOSIS — R972 Elevated prostate specific antigen [PSA]: Secondary | ICD-10-CM | POA: Diagnosis not present

## 2015-08-11 DIAGNOSIS — N401 Enlarged prostate with lower urinary tract symptoms: Secondary | ICD-10-CM | POA: Diagnosis not present

## 2015-08-11 DIAGNOSIS — R972 Elevated prostate specific antigen [PSA]: Secondary | ICD-10-CM | POA: Diagnosis not present

## 2015-08-11 DIAGNOSIS — N138 Other obstructive and reflux uropathy: Secondary | ICD-10-CM | POA: Diagnosis not present

## 2015-08-28 DIAGNOSIS — I10 Essential (primary) hypertension: Secondary | ICD-10-CM | POA: Diagnosis not present

## 2015-08-28 DIAGNOSIS — E7800 Pure hypercholesterolemia, unspecified: Secondary | ICD-10-CM | POA: Diagnosis not present

## 2015-08-28 DIAGNOSIS — R7309 Other abnormal glucose: Secondary | ICD-10-CM | POA: Diagnosis not present

## 2015-09-02 DIAGNOSIS — Z23 Encounter for immunization: Secondary | ICD-10-CM | POA: Diagnosis not present

## 2015-09-02 DIAGNOSIS — I10 Essential (primary) hypertension: Secondary | ICD-10-CM | POA: Diagnosis not present

## 2015-09-02 DIAGNOSIS — R739 Hyperglycemia, unspecified: Secondary | ICD-10-CM | POA: Diagnosis not present

## 2015-09-02 DIAGNOSIS — E7800 Pure hypercholesterolemia, unspecified: Secondary | ICD-10-CM | POA: Diagnosis not present

## 2015-09-02 DIAGNOSIS — Z125 Encounter for screening for malignant neoplasm of prostate: Secondary | ICD-10-CM | POA: Diagnosis not present

## 2015-11-11 DIAGNOSIS — M533 Sacrococcygeal disorders, not elsewhere classified: Secondary | ICD-10-CM | POA: Diagnosis not present

## 2015-11-28 DIAGNOSIS — H353131 Nonexudative age-related macular degeneration, bilateral, early dry stage: Secondary | ICD-10-CM | POA: Diagnosis not present

## 2015-12-09 DIAGNOSIS — M533 Sacrococcygeal disorders, not elsewhere classified: Secondary | ICD-10-CM | POA: Diagnosis not present

## 2015-12-30 ENCOUNTER — Ambulatory Visit (INDEPENDENT_AMBULATORY_CARE_PROVIDER_SITE_OTHER): Payer: Medicare Other | Admitting: Family Medicine

## 2015-12-30 VITALS — BP 140/84 | HR 116 | Temp 97.4°F | Resp 18 | Ht 71.0 in | Wt 198.0 lb

## 2015-12-30 DIAGNOSIS — M5431 Sciatica, right side: Secondary | ICD-10-CM

## 2015-12-30 MED ORDER — CYCLOBENZAPRINE HCL 5 MG PO TABS: ORAL_TABLET | ORAL | Status: DC

## 2015-12-30 MED ORDER — PREDNISONE 20 MG PO TABS: ORAL_TABLET | ORAL | Status: DC

## 2015-12-30 NOTE — Progress Notes (Signed)
Subjective:  By signing my name below, I, Sean Lindsey, attest that this documentation has been prepared under the direction and in the presence of Sean Ray, MD. Electronically Signed: Moises Lindsey, Heath. 12/30/2015 , 6:10 PM .  Patient was seen in Room 1 .   Patient ID: Sean Lindsey, male    DOB: 1930-07-21, 82 y.o.   MRN: [id number] Chief Complaint  Patient presents with  . Back Pain   HPI Sean Lindsey is a 82 y.o. male Pt complains of back pain towards the right hip around the right buttock. He states that it's been acting up for several days now. He's had sciatica in the past and would usually improves in a week after rest and tylenol. He's tried rest and tylenol this time around with only mild improvement, and not back to 100%. He denies any known injuries to the area. He denies any urinary or bowel symptoms, weakness or calf swelling.   He notes that his wife would receive cortisone injections and prednisone for her arthralgia.   He walks on his treadmill 15 minutes each day for his usual routine. He is still able to do this regularly through this hip pain.  He's retired. 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   Patient Active Problem List   Diagnosis Date Noted  . Bilateral lower abdominal discomfort 08/09/2012  . Gas 08/08/2012   Past Medical History  Diagnosis Date  . Hypertension     Benign  . Hypercholesterolemia   . Other abnormal glucose   . Hyperlipidemia   . Hyperglycemia   . BPH (benign prostatic hyperplasia)   . Lung nodules 11/28/2010    History of  . PSA elevation   . Lipoma     Near cecum  . Internal hemorrhoids   . Hyperplastic colon polyp   . Anxiety    Past Surgical History  Procedure Laterality Date  . Left thyroid nodule      FNA   Allergies  Allergen Reactions  . Flomax [Tamsulosin Hcl] Other (See Comments)    Fainting  . Uroxatral [Alfuzosin] Other (See Comments)    Made patient feel bad.   Prior  to Admission medications   Medication Sig Start Date End Date Taking? Authorizing Provider  ALPRAZolam (XANAX) 0.25 MG tablet Take 0.25 mg by mouth 3 (three) times daily as needed.   Yes Historical Provider, MD  amLODipine (NORVASC) 10 MG tablet Take 10 mg by mouth daily.   Yes Historical Provider, MD  aspirin 81 MG tablet Take 81 mg by mouth daily.   Yes Historical Provider, MD  atorvastatin (LIPITOR) 20 MG tablet Take 20 mg by mouth daily.   Yes Historical Provider, MD  dutasteride (AVODART) 0.5 MG capsule Take 0.5 mg by mouth daily.   Yes Historical Provider, MD  olmesartan-hydrochlorothiazide (BENICAR HCT) 40-25 MG per tablet Take 1 tablet by mouth daily.   Yes Historical Provider, MD  silodosin (RAPAFLO) 8 MG CAPS capsule Take 8 mg by mouth daily with breakfast.   Yes Historical Provider, MD  Cholecalciferol (VITAMIN D) 2000 UNITS CAPS Take by mouth. Reported on 12/30/2015    Historical Provider, MD   Social History   Social History  . Marital Status: Married    Spouse Name: N/A  . Number of Children: 2  . Years of Education: N/A   Occupational History  . Retired    Social History Main Topics  . Smoking status: Former Smoker  Quit date: 10/11/1984  . Smokeless tobacco: Never Used  . Alcohol Use: Yes     Comment: occ. wine  . Drug Use: No  . Sexual Activity: Not on file   Other Topics Concern  . Not on file   Social History Narrative   High risk of falls, patient has had 1 fall with injury within the last year.   Review of Systems  Constitutional: Negative for fever, chills and fatigue.  Cardiovascular: Negative for chest pain.  Gastrointestinal: Negative for nausea, vomiting and diarrhea.  Genitourinary: Negative for dysuria, urgency, frequency and hematuria.  Musculoskeletal: Positive for myalgias, back pain and arthralgias. Negative for joint swelling, gait problem, neck pain and neck stiffness.  Skin: Negative for rash and wound.  Neurological: Negative for  dizziness, weakness and numbness.      Objective:   Physical Exam  Constitutional: He is oriented to person, place, and time. He appears well-developed and well-nourished. No distress.  HENT:  Head: Normocephalic and atraumatic.  Eyes: EOM are normal. Pupils are equal, round, and reactive to light.  Neck: Neck supple.  Cardiovascular: Normal rate and regular rhythm.   Pulmonary/Chest: Effort normal. No respiratory distress.  Musculoskeletal: Normal range of motion.  Right leg: Some reproduction of pain with right flexion, slight reproduction with right rotation, flexion to 80 degrees, right sciatic notch is tender, SI joint non tender, positive seated straight leg raise on extension, some discomfort with heel-toe walk on right, but strength seems to be intact seated; strength is equal in lower extremities, negative homan's, no calf swelling, right hip non tender, no pain with external/internal rotation  Neurological: He is alert and oriented to person, place, and time.  Reflex Scores:      Patellar reflexes are 2+ on the right side.      Achilles reflexes are 2+ on the right side. Skin: Skin is warm and dry.  Psychiatric: He has a normal mood and affect. His behavior is normal.  Nursing note and vitals reviewed. min antalgia to gait.   Filed Vitals:   12/30/15 1640  BP: 140/84  Pulse: 116  Temp: 97.4 F (36.3 C)  TempSrc: Oral  Resp: 18  Height: 5\' 11"  (1.803 m)  Weight: 198 lb (89.812 kg)  SpO2: 98%      Assessment & Plan:   Sean Lindsey is a 82 y.o. male Right sided sciatica - Plan: cyclobenzaprine (FLEXERIL) 5 MG tablet, predniSONE (DELTASONE) 20 MG tablet   Suspected right-sided sciatica, no known injury. May be due to DDD of lumbar spine. No red flags on exam.  -Tylenol or over-the-counter Advil as needed initially, risks discussed and lowest effective dose for least amount of time for NSAIDS.   -Flexeril if needed, start at night, side effects discussed, fall and  sedation precautions.  -Printed prescription for prednisone if not improving into this weekend (SED), follow-up next week if not improving for recheck and imaging, or refer to physical medicine and rehabilitation or ortho. Sooner if worse. RTC precautions.    Meds ordered this encounter  Medications  . cyclobenzaprine (FLEXERIL) 5 MG tablet    Sig: 1 pill by mouth up to every 8 hours as needed. Start with one pill by mouth each bedtime as needed due to sedation    Dispense:  15 tablet    Refill:  0  . predniSONE (DELTASONE) 20 MG tablet    Sig: 3 by mouth for 3 days, then 2 by mouth for 2 days, then 1 by  mouth for 2 days, then 1/2 by mouth for 2 days.    Dispense:  16 tablet    Refill:  0   Patient Instructions  Your symptoms do appear to be started due to sciatica. As we discussed, this can sometimes be due to arthritis or pinched nerve at the back. You can initially try Tylenol and ibuprofen over-the-counter (as we discussed, use smallest effective dose and for least amount of time with NSAIDS as they can affect Lindsey pressure and have heart risks). Muscle relaxant Flexeril at bedtime as needed. This medicine can cause sedation, so be careful if taking it. If your symptoms are not improving into this weekend, you can take the prednisone as discussed. However if pain is not improving into next week, or any worsening sooner, recommend recheck here or other medical provider.  Sciatica Sciatica is pain, weakness, numbness, or tingling along the path of the sciatic nerve. The nerve starts in the lower back and runs down the back of each leg. The nerve controls the muscles in the lower leg and in the back of the knee, while also providing sensation to the back of the thigh, lower leg, and the sole of your foot. Sciatica is a symptom of another medical condition. For instance, nerve damage or certain conditions, such as a herniated disk or bone spur on the spine, pinch or put pressure on the sciatic  nerve. This causes the pain, weakness, or other sensations normally associated with sciatica. Generally, sciatica only affects one side of the body. CAUSES   Herniated or slipped disc.  Degenerative disk disease.  A pain disorder involving the narrow muscle in the buttocks (piriformis syndrome).  Pelvic injury or fracture.  Pregnancy.  Tumor (rare). SYMPTOMS  Symptoms can vary from mild to very severe. The symptoms usually travel from the low back to the buttocks and down the back of the leg. Symptoms can include:  Mild tingling or dull aches in the lower back, leg, or hip.  Numbness in the back of the calf or sole of the foot.  Burning sensations in the lower back, leg, or hip.  Sharp pains in the lower back, leg, or hip.  Leg weakness.  Severe back pain inhibiting movement. These symptoms may get worse with coughing, sneezing, laughing, or prolonged sitting or standing. Also, being overweight may worsen symptoms. DIAGNOSIS  Your caregiver will perform a physical exam to look for common symptoms of sciatica. He or she may ask you to do certain movements or activities that would trigger sciatic nerve pain. Other tests may be performed to find the cause of the sciatica. These may include:  Lindsey tests.  X-rays.  Imaging tests, such as an MRI or CT scan. TREATMENT  Treatment is directed at the cause of the sciatic pain. Sometimes, treatment is not necessary and the pain and discomfort goes away on its own. If treatment is needed, your caregiver may suggest:  Over-the-counter medicines to relieve pain.  Prescription medicines, such as anti-inflammatory medicine, muscle relaxants, or narcotics.  Applying heat or ice to the painful area.  Steroid injections to lessen pain, irritation, and inflammation around the nerve.  Reducing activity during periods of pain.  Exercising and stretching to strengthen your abdomen and improve flexibility of your spine. Your caregiver may  suggest losing weight if the extra weight makes the back pain worse.  Physical therapy.  Surgery to eliminate what is pressing or pinching the nerve, such as a bone spur or part of a  herniated disk. HOME CARE INSTRUCTIONS   Only take over-the-counter or prescription medicines for pain or discomfort as directed by your caregiver.  Apply ice to the affected area for 20 minutes, 3-4 times a day for the first 48-72 hours. Then try heat in the same way.  Exercise, stretch, or perform your usual activities if these do not aggravate your pain.  Attend physical therapy sessions as directed by your caregiver.  Keep all follow-up appointments as directed by your caregiver.  Do not wear high heels or shoes that do not provide proper support.  Check your mattress to see if it is too soft. A firm mattress may lessen your pain and discomfort. SEEK IMMEDIATE MEDICAL CARE IF:   You lose control of your bowel or bladder (incontinence).  You have increasing weakness in the lower back, pelvis, buttocks, or legs.  You have redness or swelling of your back.  You have a burning sensation when you urinate.  You have pain that gets worse when you lie down or awakens you at night.  Your pain is worse than you have experienced in the past.  Your pain is lasting longer than 4 weeks.  You are suddenly losing weight without reason. MAKE SURE YOU:  Understand these instructions.  Will watch your condition.  Will get help right away if you are not doing well or get worse.   This information is not intended to replace advice given to you by your health care provider. Make sure you discuss any questions you have with your health care provider.   Document Released: 09/21/2001 Document Revised: 06/18/2015 Document Reviewed: 02/06/2012 Elsevier Interactive Patient Education [employer].     I personally performed the services described in this documentation, which was scribed in my presence.  The recorded information has been reviewed and considered, and addended by me as needed.

## 2015-12-30 NOTE — Patient Instructions (Signed)
Your symptoms do appear to be started due to sciatica. As we discussed, this can sometimes be due to arthritis or pinched nerve at the back. You can initially try Tylenol and ibuprofen over-the-counter (as we discussed, use smallest effective dose and for least amount of time with NSAIDS as they can affect blood pressure and have heart risks). Muscle relaxant Flexeril at bedtime as needed. This medicine can cause sedation, so be careful if taking it. If your symptoms are not improving into this weekend, you can take the prednisone as discussed. However if pain is not improving into next week, or any worsening sooner, recommend recheck here or other medical provider.  Sciatica Sciatica is pain, weakness, numbness, or tingling along the path of the sciatic nerve. The nerve starts in the lower back and runs down the back of each leg. The nerve controls the muscles in the lower leg and in the back of the knee, while also providing sensation to the back of the thigh, lower leg, and the sole of your foot. Sciatica is a symptom of another medical condition. For instance, nerve damage or certain conditions, such as a herniated disk or bone spur on the spine, pinch or put pressure on the sciatic nerve. This causes the pain, weakness, or other sensations normally associated with sciatica. Generally, sciatica only affects one side of the body. CAUSES   Herniated or slipped disc.  Degenerative disk disease.  A pain disorder involving the narrow muscle in the buttocks (piriformis syndrome).  Pelvic injury or fracture.  Pregnancy.  Tumor (rare). SYMPTOMS  Symptoms can vary from mild to very severe. The symptoms usually travel from the low back to the buttocks and down the back of the leg. Symptoms can include:  Mild tingling or dull aches in the lower back, leg, or hip.  Numbness in the back of the calf or sole of the foot.  Burning sensations in the lower back, leg, or hip.  Sharp pains in the lower  back, leg, or hip.  Leg weakness.  Severe back pain inhibiting movement. These symptoms may get worse with coughing, sneezing, laughing, or prolonged sitting or standing. Also, being overweight may worsen symptoms. DIAGNOSIS  Your caregiver will perform a physical exam to look for common symptoms of sciatica. He or she may ask you to do certain movements or activities that would trigger sciatic nerve pain. Other tests may be performed to find the cause of the sciatica. These may include:  Blood tests.  X-rays.  Imaging tests, such as an MRI or CT scan. TREATMENT  Treatment is directed at the cause of the sciatic pain. Sometimes, treatment is not necessary and the pain and discomfort goes away on its own. If treatment is needed, your caregiver may suggest:  Over-the-counter medicines to relieve pain.  Prescription medicines, such as anti-inflammatory medicine, muscle relaxants, or narcotics.  Applying heat or ice to the painful area.  Steroid injections to lessen pain, irritation, and inflammation around the nerve.  Reducing activity during periods of pain.  Exercising and stretching to strengthen your abdomen and improve flexibility of your spine. Your caregiver may suggest losing weight if the extra weight makes the back pain worse.  Physical therapy.  Surgery to eliminate what is pressing or pinching the nerve, such as a bone spur or part of a herniated disk. HOME CARE INSTRUCTIONS   Only take over-the-counter or prescription medicines for pain or discomfort as directed by your caregiver.  Apply ice to the affected area for  20 minutes, 3-4 times a day for the first 48-72 hours. Then try heat in the same way.  Exercise, stretch, or perform your usual activities if these do not aggravate your pain.  Attend physical therapy sessions as directed by your caregiver.  Keep all follow-up appointments as directed by your caregiver.  Do not wear high heels or shoes that do not  provide proper support.  Check your mattress to see if it is too soft. A firm mattress may lessen your pain and discomfort. SEEK IMMEDIATE MEDICAL CARE IF:   You lose control of your bowel or bladder (incontinence).  You have increasing weakness in the lower back, pelvis, buttocks, or legs.  You have redness or swelling of your back.  You have a burning sensation when you urinate.  You have pain that gets worse when you lie down or awakens you at night.  Your pain is worse than you have experienced in the past.  Your pain is lasting longer than 4 weeks.  You are suddenly losing weight without reason. MAKE SURE YOU:  Understand these instructions.  Will watch your condition.  Will get help right away if you are not doing well or get worse.   This information is not intended to replace advice given to you by your health care provider. Make sure you discuss any questions you have with your health care provider.   Document Released: 09/21/2001 Document Revised: 06/18/2015 Document Reviewed: 02/06/2012 Elsevier Interactive Patient Education [employer].

## 2016-01-05 DIAGNOSIS — M5430 Sciatica, unspecified side: Secondary | ICD-10-CM | POA: Diagnosis not present

## 2016-01-05 DIAGNOSIS — M47816 Spondylosis without myelopathy or radiculopathy, lumbar region: Secondary | ICD-10-CM | POA: Diagnosis not present

## 2016-01-05 DIAGNOSIS — M79604 Pain in right leg: Secondary | ICD-10-CM | POA: Diagnosis not present

## 2016-01-20 ENCOUNTER — Emergency Department (HOSPITAL_COMMUNITY)
Admission: EM | Admit: 2016-01-20 | Discharge: 2016-01-20 | Disposition: A | Discharge status: Home / Self Care | Payer: Medicare Other | Attending: Emergency Medicine | Admitting: Emergency Medicine

## 2016-01-20 ENCOUNTER — Encounter (HOSPITAL_COMMUNITY): Payer: Self-pay | Admitting: *Deleted

## 2016-01-20 DIAGNOSIS — E7800 Pure hypercholesterolemia, unspecified: Secondary | ICD-10-CM | POA: Diagnosis not present

## 2016-01-20 DIAGNOSIS — Z86018 Personal history of other benign neoplasm: Secondary | ICD-10-CM | POA: Diagnosis not present

## 2016-01-20 DIAGNOSIS — F419 Anxiety disorder, unspecified: Secondary | ICD-10-CM | POA: Insufficient documentation

## 2016-01-20 DIAGNOSIS — Z86010 Personal history of colonic polyps: Secondary | ICD-10-CM | POA: Insufficient documentation

## 2016-01-20 DIAGNOSIS — Z7982 Long term (current) use of aspirin: Secondary | ICD-10-CM | POA: Insufficient documentation

## 2016-01-20 DIAGNOSIS — E785 Hyperlipidemia, unspecified: Secondary | ICD-10-CM | POA: Diagnosis not present

## 2016-01-20 DIAGNOSIS — N400 Enlarged prostate without lower urinary tract symptoms: Secondary | ICD-10-CM | POA: Diagnosis not present

## 2016-01-20 DIAGNOSIS — Z79899 Other long term (current) drug therapy: Secondary | ICD-10-CM | POA: Insufficient documentation

## 2016-01-20 DIAGNOSIS — Z87891 Personal history of nicotine dependence: Secondary | ICD-10-CM | POA: Insufficient documentation

## 2016-01-20 DIAGNOSIS — M549 Dorsalgia, unspecified: Secondary | ICD-10-CM | POA: Diagnosis not present

## 2016-01-20 DIAGNOSIS — I10 Essential (primary) hypertension: Secondary | ICD-10-CM

## 2016-01-20 NOTE — ED Notes (Signed)
Dr. Nanavati at bedside 

## 2016-01-20 NOTE — ED Notes (Signed)
Pt c/o HTN sys 180 BP last night, denies blurred vision, states, "I felt some buzzing in my ear." pt denies CP & SOB, pt takes HTN meds, pt reports recently being treated for sciatica & has no relief with Ibuprofen with the R leg, A&O X4

## 2016-01-20 NOTE — Discharge Instructions (Signed)
Sean Lindsey,  Nice meeting you! Please follow-up with your primary care provider. Return to the emergency department if you develop chest pain, shortness of breath, inability to walk, fevers, chills, new/worsening symptoms. Feel better soon!  S. Wendie Simmer, PA-C Hypertension Hypertension, commonly called high blood pressure, is when the force of blood pumping through your arteries is too strong. Your arteries are the blood vessels that carry blood from your heart throughout your body. A blood pressure reading consists of a higher number over a lower number, such as 110/72. The higher number (systolic) is the pressure inside your arteries when your heart pumps. The lower number (diastolic) is the pressure inside your arteries when your heart relaxes. Ideally you want your blood pressure below 120/80. Hypertension forces your heart to work harder to pump blood. Your arteries may become narrow or stiff. Having untreated or uncontrolled hypertension can cause heart attack, stroke, kidney disease, and other problems. RISK FACTORS Some risk factors for high blood pressure are controllable. Others are not.  Risk factors you cannot control include:   Race. You may be at higher risk if you are African American.  Age. Risk increases with age.  Gender. Men are at higher risk than women before age 26 years. After age 79, women are at higher risk than men. Risk factors you can control include:  Not getting enough exercise or physical activity.  Being overweight.  Getting too much fat, sugar, calories, or salt in your diet.  Drinking too much alcohol. SIGNS AND SYMPTOMS Hypertension does not usually cause signs or symptoms. Extremely high blood pressure (hypertensive crisis) may cause headache, anxiety, shortness of breath, and nosebleed. DIAGNOSIS To check if you have hypertension, your health care provider will measure your blood pressure while you are seated, with your arm held at the  level of your heart. It should be measured at least twice using the same arm. Certain conditions can cause a difference in blood pressure between your right and left arms. A blood pressure reading that is higher than normal on one occasion does not mean that you need treatment. If it is not clear whether you have high blood pressure, you may be asked to return on a different day to have your blood pressure checked again. Or, you may be asked to monitor your blood pressure at home for 1 or more weeks. TREATMENT Treating high blood pressure includes making lifestyle changes and possibly taking medicine. Living a healthy lifestyle can help lower high blood pressure. You may need to change some of your habits. Lifestyle changes may include:  Following the DASH diet. This diet is high in fruits, vegetables, and whole grains. It is low in salt, red meat, and added sugars.  Keep your sodium intake below 2,300 mg per day.  Getting at least 30-45 minutes of aerobic exercise at least 4 times per week.  Losing weight if necessary.  Not smoking.  Limiting alcoholic beverages.  Learning ways to reduce stress. Your health care provider may prescribe medicine if lifestyle changes are not enough to get your blood pressure under control, and if one of the following is true:  You are 26-63 years of age and your systolic blood pressure is above 140.  You are 16 years of age or older, and your systolic blood pressure is above 150.  Your diastolic blood pressure is above 90.  You have diabetes, and your systolic blood pressure is over [id number] or your diastolic blood pressure is over 90.  You have kidney disease and your blood pressure is above 140/90.  You have heart disease and your blood pressure is above 140/90. Your personal target blood pressure may vary depending on your medical conditions, your age, and other factors. HOME CARE INSTRUCTIONS  Have your blood pressure rechecked as directed by your  health care provider.   Take medicines only as directed by your health care provider. Follow the directions carefully. Blood pressure medicines must be taken as prescribed. The medicine does not work as well when you skip doses. Skipping doses also puts you at risk for problems.  Do not smoke.   Monitor your blood pressure at home as directed by your health care provider. SEEK MEDICAL CARE IF:   You think you are having a reaction to medicines taken.  You have recurrent headaches or feel dizzy.  You have swelling in your ankles.  You have trouble with your vision. SEEK IMMEDIATE MEDICAL CARE IF:  You develop a severe headache or confusion.  You have unusual weakness, numbness, or feel faint.  You have severe chest or abdominal pain.  You vomit repeatedly.  You have trouble breathing. MAKE SURE YOU:   Understand these instructions.  Will watch your condition.  Will get help right away if you are not doing well or get worse.   This information is not intended to replace advice given to you by your health care provider. Make sure you discuss any questions you have with your health care provider.   Document Released: 09/27/2005 Document Revised: 02/11/2015 Document Reviewed: 07/20/2013 Elsevier Interactive Patient Education [employer].

## 2016-01-20 NOTE — ED Notes (Signed)
Pt comfortable with discharge and follow up instructions. Pt declines wheelchair, escorted to waiting area by this RN. Rx x0 

## 2016-01-21 DIAGNOSIS — M5430 Sciatica, unspecified side: Secondary | ICD-10-CM | POA: Diagnosis not present

## 2016-01-23 NOTE — ED Provider Notes (Signed)
CSN: [id number]     Arrival date & time 01/20/16  1005 History   First MD Initiated Contact with Patient 01/20/16 1258     Chief Complaint  Patient presents with  . Hypertension   HPI  Sean Lindsey is a 82 y.o. male PMH significant for HTN, hyperlipidemia, anxiety presenting with one episode of hypertension last night. He states his systolic was [id number]. He felt "some buzzing" in his ear. He denies fevers, chills, CP, SOB, abdominal pain, N/V, changes in bowel/bladder habits.  He states he was recently started on ibuprofen for right leg sciatica (sharp, shooting, burning pain down right leg, no claudication signs) but after speaking with his son who is a pharmacist, he believes the ibuprofen may have been the cause of his hypertension. He endorses being compliant with his HTN and hyperlipidemia meds. He stopped taking his ibuprofen last night. He is asymptomatic currently.   Past Medical History  Diagnosis Date  . Hypertension     Benign  . Hypercholesterolemia   . Other abnormal glucose   . Hyperlipidemia   . Hyperglycemia   . BPH (benign prostatic hyperplasia)   . Lung nodules 11/28/2010    History of  . PSA elevation   . Lipoma     Near cecum  . Internal hemorrhoids   . Hyperplastic colon polyp   . Anxiety    Past Surgical History  Procedure Laterality Date  . Left thyroid nodule      FNA   Family History  Problem Relation Age of Onset  . Kidney failure Father     Deceased  . Heart disease Sister   . Liver cancer Brother   . Leukemia Brother   . Lymphoma Sister   . Lymphoma Brother   . Gastric cancer Brother   . Gastric cancer Mother    Social History  Substance Use Topics  . Smoking status: Former Smoker    Quit date: 10/11/1984  . Smokeless tobacco: Never Used  . Alcohol Use: Yes     Comment: occ. wine    Review of Systems  Ten systems are reviewed and are negative for acute change except as noted in the HPI  Allergies  Flomax and Uroxatral  Home  Medications   Prior to Admission medications   Medication Sig Start Date End Date Taking? Authorizing Provider  ALPRAZolam (XANAX) 0.25 MG tablet Take 0.25 mg by mouth 3 (three) times daily as needed.   Yes Historical Provider, MD  amLODipine (NORVASC) 10 MG tablet Take 10 mg by mouth daily.   Yes Historical Provider, MD  aspirin 81 MG tablet Take 81 mg by mouth daily.   Yes Historical Provider, MD  atorvastatin (LIPITOR) 20 MG tablet Take 20 mg by mouth daily.   Yes Historical Provider, MD  Cholecalciferol (VITAMIN D) 2000 UNITS CAPS Take by mouth. Reported on 12/30/2015   Yes Historical Provider, MD  finasteride (PROSCAR) 5 MG tablet Take 5 mg by mouth daily.   Yes Historical Provider, MD  ibuprofen (ADVIL,MOTRIN) 200 MG tablet Take 200 mg by mouth every 6 (six) hours as needed for moderate pain.   Yes Historical Provider, MD  olmesartan-hydrochlorothiazide (BENICAR HCT) 40-25 MG per tablet Take 1 tablet by mouth daily.   Yes Historical Provider, MD  silodosin (RAPAFLO) 8 MG CAPS capsule Take 8 mg by mouth daily with breakfast.   Yes Historical Provider, MD  cyclobenzaprine (FLEXERIL) 5 MG tablet 1 pill by mouth up to every 8 hours as needed. Start  with one pill by mouth each bedtime as needed due to sedation Patient not taking: Reported on 01/20/2016 12/30/15   Wendie Agreste, MD  dutasteride (AVODART) 0.5 MG capsule Take 0.5 mg by mouth daily.    Historical Provider, MD  predniSONE (DELTASONE) 20 MG tablet 3 by mouth for 3 days, then 2 by mouth for 2 days, then 1 by mouth for 2 days, then 1/2 by mouth for 2 days. Patient not taking: Reported on 01/20/2016 12/30/15   Wendie Agreste, MD   BP 185/77 mmHg  Pulse 85  Temp(Src) 97.5 F (36.4 C) (Oral)  Resp 18  SpO2 96% Physical Exam  Constitutional: He appears well-developed and well-nourished. No distress.  HENT:  Head: Normocephalic and atraumatic.  Mouth/Throat: Oropharynx is clear and moist. No oropharyngeal exudate.  Eyes:  Conjunctivae are normal. Pupils are equal, round, and reactive to light. Right eye exhibits no discharge. Left eye exhibits no discharge. No scleral icterus.  Neck: No tracheal deviation present.  Cardiovascular: Normal rate, regular rhythm, normal heart sounds and intact distal pulses.  Exam reveals no gallop and no friction rub.   No murmur heard. Pulmonary/Chest: Effort normal and breath sounds normal. No respiratory distress. He has no wheezes. He has no rales. He exhibits no tenderness.  Abdominal: Soft. Bowel sounds are normal. He exhibits no distension and no mass. There is no tenderness. There is no rebound and no guarding.  Musculoskeletal: He exhibits no edema.       Arms: Lymphadenopathy:    He has no cervical adenopathy.  Neurological: He is alert. Coordination normal.  Skin: Skin is warm and dry. No rash noted. He is not diaphoretic. No erythema.  Psychiatric: He has a normal mood and affect. His behavior is normal.  Nursing note and vitals reviewed.   ED Course  Procedures   MDM   Final diagnoses:  Essential hypertension   Patient nontoxic appearing, VSS. Patient is slightly hypertensive but without tachycardia, hypoxia, and is asymptomatic. Dr. Kathrynn Humble evaluated patient as well, and no further workup is indicated at this time. Patient may be safely discharged home. Discussed reasons for return. Patient to follow-up with primary care provider within one week. Patient in understanding and agreement with the plan.   Falkland Lions, PA-C 01/24/16 [place], MD 01/27/16 [phone]

## 2016-01-28 DIAGNOSIS — E7800 Pure hypercholesterolemia, unspecified: Secondary | ICD-10-CM | POA: Diagnosis not present

## 2016-01-28 DIAGNOSIS — M5431 Sciatica, right side: Secondary | ICD-10-CM | POA: Diagnosis not present

## 2016-01-28 DIAGNOSIS — Z125 Encounter for screening for malignant neoplasm of prostate: Secondary | ICD-10-CM | POA: Diagnosis not present

## 2016-01-28 DIAGNOSIS — I10 Essential (primary) hypertension: Secondary | ICD-10-CM | POA: Diagnosis not present

## 2016-01-28 DIAGNOSIS — R739 Hyperglycemia, unspecified: Secondary | ICD-10-CM | POA: Diagnosis not present

## 2016-01-29 ENCOUNTER — Other Ambulatory Visit: Payer: Self-pay | Admitting: Internal Medicine

## 2016-01-29 DIAGNOSIS — M5431 Sciatica, right side: Secondary | ICD-10-CM

## 2016-02-05 ENCOUNTER — Other Ambulatory Visit: Payer: Self-pay | Admitting: Internal Medicine

## 2016-02-05 ENCOUNTER — Ambulatory Visit
Admission: RE | Admit: 2016-02-05 | Discharge: 2016-02-05 | Disposition: A | Discharge status: Home / Self Care | Payer: Medicare Other | Source: Ambulatory Visit | Attending: Internal Medicine | Admitting: Internal Medicine

## 2016-02-05 DIAGNOSIS — M5431 Sciatica, right side: Secondary | ICD-10-CM

## 2016-02-05 DIAGNOSIS — M5126 Other intervertebral disc displacement, lumbar region: Secondary | ICD-10-CM | POA: Diagnosis not present

## 2016-02-05 MED ORDER — GADOBENATE DIMEGLUMINE 529 MG/ML IV SOLN
17.0000 mL | Freq: Once | INTRAVENOUS | Status: AC | PRN
  Administered 2016-02-05: 17 mL via INTRAVENOUS

## 2016-02-10 DIAGNOSIS — I10 Essential (primary) hypertension: Secondary | ICD-10-CM | POA: Diagnosis not present

## 2016-02-10 DIAGNOSIS — K5900 Constipation, unspecified: Secondary | ICD-10-CM | POA: Diagnosis not present

## 2016-02-10 DIAGNOSIS — M5431 Sciatica, right side: Secondary | ICD-10-CM | POA: Diagnosis not present

## 2016-02-11 DIAGNOSIS — I10 Essential (primary) hypertension: Secondary | ICD-10-CM | POA: Diagnosis not present

## 2016-02-11 DIAGNOSIS — Z6825 Body mass index (BMI) 25.0-25.9, adult: Secondary | ICD-10-CM | POA: Diagnosis not present

## 2016-02-11 DIAGNOSIS — M5137 Other intervertebral disc degeneration, lumbosacral region: Secondary | ICD-10-CM | POA: Diagnosis not present

## 2016-02-11 DIAGNOSIS — M545 Low back pain: Secondary | ICD-10-CM | POA: Diagnosis not present

## 2016-02-11 DIAGNOSIS — M5416 Radiculopathy, lumbar region: Secondary | ICD-10-CM | POA: Diagnosis not present

## 2016-02-11 DIAGNOSIS — M5126 Other intervertebral disc displacement, lumbar region: Secondary | ICD-10-CM | POA: Diagnosis not present

## 2016-02-16 DIAGNOSIS — M5127 Other intervertebral disc displacement, lumbosacral region: Secondary | ICD-10-CM | POA: Diagnosis not present

## 2016-03-03 DIAGNOSIS — M5416 Radiculopathy, lumbar region: Secondary | ICD-10-CM | POA: Diagnosis not present

## 2016-03-03 DIAGNOSIS — M5127 Other intervertebral disc displacement, lumbosacral region: Secondary | ICD-10-CM | POA: Diagnosis not present

## 2016-03-03 DIAGNOSIS — M545 Low back pain: Secondary | ICD-10-CM | POA: Diagnosis not present

## 2016-03-04 DIAGNOSIS — E871 Hypo-osmolality and hyponatremia: Secondary | ICD-10-CM | POA: Diagnosis not present

## 2016-03-09 DIAGNOSIS — E7800 Pure hypercholesterolemia, unspecified: Secondary | ICD-10-CM | POA: Diagnosis not present

## 2016-03-09 DIAGNOSIS — I10 Essential (primary) hypertension: Secondary | ICD-10-CM | POA: Diagnosis not present

## 2016-03-09 DIAGNOSIS — M5431 Sciatica, right side: Secondary | ICD-10-CM | POA: Diagnosis not present

## 2016-03-10 DIAGNOSIS — M545 Low back pain: Secondary | ICD-10-CM | POA: Diagnosis not present

## 2016-03-10 DIAGNOSIS — M6281 Muscle weakness (generalized): Secondary | ICD-10-CM | POA: Diagnosis not present

## 2016-03-10 DIAGNOSIS — I10 Essential (primary) hypertension: Secondary | ICD-10-CM | POA: Diagnosis not present

## 2016-03-10 DIAGNOSIS — M5441 Lumbago with sciatica, right side: Secondary | ICD-10-CM | POA: Diagnosis not present

## 2016-03-15 DIAGNOSIS — Z6825 Body mass index (BMI) 25.0-25.9, adult: Secondary | ICD-10-CM | POA: Diagnosis not present

## 2016-03-15 DIAGNOSIS — M5126 Other intervertebral disc displacement, lumbar region: Secondary | ICD-10-CM | POA: Diagnosis not present

## 2016-03-15 DIAGNOSIS — M545 Low back pain: Secondary | ICD-10-CM | POA: Diagnosis not present

## 2016-03-15 DIAGNOSIS — I10 Essential (primary) hypertension: Secondary | ICD-10-CM | POA: Diagnosis not present

## 2016-03-15 DIAGNOSIS — M5416 Radiculopathy, lumbar region: Secondary | ICD-10-CM | POA: Diagnosis not present

## 2016-03-15 DIAGNOSIS — M5127 Other intervertebral disc displacement, lumbosacral region: Secondary | ICD-10-CM | POA: Diagnosis not present

## 2016-03-16 DIAGNOSIS — M545 Low back pain: Secondary | ICD-10-CM | POA: Diagnosis not present

## 2016-03-16 DIAGNOSIS — I10 Essential (primary) hypertension: Secondary | ICD-10-CM | POA: Diagnosis not present

## 2016-03-16 DIAGNOSIS — M6281 Muscle weakness (generalized): Secondary | ICD-10-CM | POA: Diagnosis not present

## 2016-03-16 DIAGNOSIS — M5441 Lumbago with sciatica, right side: Secondary | ICD-10-CM | POA: Diagnosis not present

## 2016-03-30 DIAGNOSIS — I10 Essential (primary) hypertension: Secondary | ICD-10-CM | POA: Diagnosis not present

## 2016-03-30 DIAGNOSIS — M6281 Muscle weakness (generalized): Secondary | ICD-10-CM | POA: Diagnosis not present

## 2016-03-30 DIAGNOSIS — M5441 Lumbago with sciatica, right side: Secondary | ICD-10-CM | POA: Diagnosis not present

## 2016-03-30 DIAGNOSIS — M545 Low back pain: Secondary | ICD-10-CM | POA: Diagnosis not present

## 2016-04-06 DIAGNOSIS — I10 Essential (primary) hypertension: Secondary | ICD-10-CM | POA: Diagnosis not present

## 2016-04-06 DIAGNOSIS — M6281 Muscle weakness (generalized): Secondary | ICD-10-CM | POA: Diagnosis not present

## 2016-04-06 DIAGNOSIS — M545 Low back pain: Secondary | ICD-10-CM | POA: Diagnosis not present

## 2016-04-06 DIAGNOSIS — M5441 Lumbago with sciatica, right side: Secondary | ICD-10-CM | POA: Diagnosis not present

## 2016-04-16 DIAGNOSIS — I10 Essential (primary) hypertension: Secondary | ICD-10-CM | POA: Diagnosis not present

## 2016-04-16 DIAGNOSIS — E7800 Pure hypercholesterolemia, unspecified: Secondary | ICD-10-CM | POA: Diagnosis not present

## 2016-04-16 DIAGNOSIS — R739 Hyperglycemia, unspecified: Secondary | ICD-10-CM | POA: Diagnosis not present

## 2016-06-28 DIAGNOSIS — L401 Generalized pustular psoriasis: Secondary | ICD-10-CM | POA: Diagnosis not present

## 2016-07-13 DIAGNOSIS — I10 Essential (primary) hypertension: Secondary | ICD-10-CM | POA: Diagnosis not present

## 2016-07-13 DIAGNOSIS — R739 Hyperglycemia, unspecified: Secondary | ICD-10-CM | POA: Diagnosis not present

## 2016-07-20 DIAGNOSIS — Z23 Encounter for immunization: Secondary | ICD-10-CM | POA: Diagnosis not present

## 2016-07-20 DIAGNOSIS — E7800 Pure hypercholesterolemia, unspecified: Secondary | ICD-10-CM | POA: Diagnosis not present

## 2016-07-20 DIAGNOSIS — R739 Hyperglycemia, unspecified: Secondary | ICD-10-CM | POA: Diagnosis not present

## 2016-07-20 DIAGNOSIS — I10 Essential (primary) hypertension: Secondary | ICD-10-CM | POA: Diagnosis not present

## 2016-07-20 DIAGNOSIS — L409 Psoriasis, unspecified: Secondary | ICD-10-CM | POA: Diagnosis not present

## 2016-07-26 DIAGNOSIS — L720 Epidermal cyst: Secondary | ICD-10-CM | POA: Diagnosis not present

## 2016-07-26 DIAGNOSIS — L309 Dermatitis, unspecified: Secondary | ICD-10-CM | POA: Diagnosis not present

## 2016-08-23 DIAGNOSIS — I8310 Varicose veins of unspecified lower extremity with inflammation: Secondary | ICD-10-CM | POA: Diagnosis not present

## 2016-08-23 DIAGNOSIS — L570 Actinic keratosis: Secondary | ICD-10-CM | POA: Diagnosis not present

## 2016-09-20 DIAGNOSIS — N401 Enlarged prostate with lower urinary tract symptoms: Secondary | ICD-10-CM | POA: Diagnosis not present

## 2016-09-20 DIAGNOSIS — R351 Nocturia: Secondary | ICD-10-CM | POA: Diagnosis not present

## 2016-10-21 DIAGNOSIS — R05 Cough: Secondary | ICD-10-CM | POA: Diagnosis not present

## 2016-10-21 DIAGNOSIS — R079 Chest pain, unspecified: Secondary | ICD-10-CM | POA: Diagnosis not present

## 2016-11-30 DIAGNOSIS — H353131 Nonexudative age-related macular degeneration, bilateral, early dry stage: Secondary | ICD-10-CM | POA: Diagnosis not present

## 2016-12-06 DIAGNOSIS — I4891 Unspecified atrial fibrillation: Secondary | ICD-10-CM | POA: Diagnosis not present

## 2016-12-16 DIAGNOSIS — I4891 Unspecified atrial fibrillation: Secondary | ICD-10-CM | POA: Diagnosis not present

## 2016-12-17 DIAGNOSIS — I4891 Unspecified atrial fibrillation: Secondary | ICD-10-CM | POA: Diagnosis not present

## 2016-12-17 DIAGNOSIS — R0789 Other chest pain: Secondary | ICD-10-CM | POA: Diagnosis not present

## 2017-01-03 DIAGNOSIS — I10 Essential (primary) hypertension: Secondary | ICD-10-CM | POA: Diagnosis not present

## 2017-01-03 DIAGNOSIS — I480 Paroxysmal atrial fibrillation: Secondary | ICD-10-CM | POA: Diagnosis not present

## 2017-01-03 DIAGNOSIS — I4891 Unspecified atrial fibrillation: Secondary | ICD-10-CM | POA: Diagnosis not present

## 2017-01-03 DIAGNOSIS — R0789 Other chest pain: Secondary | ICD-10-CM | POA: Diagnosis not present

## 2017-01-19 DIAGNOSIS — E7800 Pure hypercholesterolemia, unspecified: Secondary | ICD-10-CM | POA: Diagnosis not present

## 2017-01-19 DIAGNOSIS — I10 Essential (primary) hypertension: Secondary | ICD-10-CM | POA: Diagnosis not present

## 2017-01-19 DIAGNOSIS — Z125 Encounter for screening for malignant neoplasm of prostate: Secondary | ICD-10-CM | POA: Diagnosis not present

## 2017-01-19 DIAGNOSIS — R739 Hyperglycemia, unspecified: Secondary | ICD-10-CM | POA: Diagnosis not present

## 2017-01-26 DIAGNOSIS — E7800 Pure hypercholesterolemia, unspecified: Secondary | ICD-10-CM | POA: Diagnosis not present

## 2017-01-26 DIAGNOSIS — R739 Hyperglycemia, unspecified: Secondary | ICD-10-CM | POA: Diagnosis not present

## 2017-01-26 DIAGNOSIS — F419 Anxiety disorder, unspecified: Secondary | ICD-10-CM | POA: Diagnosis not present

## 2017-01-26 DIAGNOSIS — I10 Essential (primary) hypertension: Secondary | ICD-10-CM | POA: Diagnosis not present

## 2017-02-07 DIAGNOSIS — M533 Sacrococcygeal disorders, not elsewhere classified: Secondary | ICD-10-CM | POA: Diagnosis not present

## 2017-03-11 DIAGNOSIS — Z6831 Body mass index (BMI) 31.0-31.9, adult: Secondary | ICD-10-CM | POA: Diagnosis not present

## 2017-03-11 DIAGNOSIS — M533 Sacrococcygeal disorders, not elsewhere classified: Secondary | ICD-10-CM | POA: Diagnosis not present

## 2017-03-11 DIAGNOSIS — I10 Essential (primary) hypertension: Secondary | ICD-10-CM | POA: Diagnosis not present

## 2017-04-11 DIAGNOSIS — I452 Bifascicular block: Secondary | ICD-10-CM | POA: Diagnosis not present

## 2017-04-11 DIAGNOSIS — I10 Essential (primary) hypertension: Secondary | ICD-10-CM | POA: Diagnosis not present

## 2017-04-11 DIAGNOSIS — I480 Paroxysmal atrial fibrillation: Secondary | ICD-10-CM | POA: Diagnosis not present

## 2017-04-19 DIAGNOSIS — I480 Paroxysmal atrial fibrillation: Secondary | ICD-10-CM | POA: Diagnosis not present

## 2017-05-12 DIAGNOSIS — I480 Paroxysmal atrial fibrillation: Secondary | ICD-10-CM | POA: Diagnosis not present

## 2017-05-12 DIAGNOSIS — I452 Bifascicular block: Secondary | ICD-10-CM | POA: Diagnosis not present

## 2017-05-12 DIAGNOSIS — I10 Essential (primary) hypertension: Secondary | ICD-10-CM | POA: Diagnosis not present

## 2017-05-19 DIAGNOSIS — M533 Sacrococcygeal disorders, not elsewhere classified: Secondary | ICD-10-CM | POA: Diagnosis not present

## 2017-06-22 DIAGNOSIS — Z6832 Body mass index (BMI) 32.0-32.9, adult: Secondary | ICD-10-CM | POA: Diagnosis not present

## 2017-06-22 DIAGNOSIS — M533 Sacrococcygeal disorders, not elsewhere classified: Secondary | ICD-10-CM | POA: Diagnosis not present

## 2017-06-22 DIAGNOSIS — I10 Essential (primary) hypertension: Secondary | ICD-10-CM | POA: Diagnosis not present

## 2017-06-27 DIAGNOSIS — M542 Cervicalgia: Secondary | ICD-10-CM | POA: Diagnosis not present

## 2017-07-20 DIAGNOSIS — Z5181 Encounter for therapeutic drug level monitoring: Secondary | ICD-10-CM | POA: Diagnosis not present

## 2017-07-20 DIAGNOSIS — I10 Essential (primary) hypertension: Secondary | ICD-10-CM | POA: Diagnosis not present

## 2017-07-20 DIAGNOSIS — Z79899 Other long term (current) drug therapy: Secondary | ICD-10-CM | POA: Diagnosis not present

## 2017-07-20 DIAGNOSIS — R739 Hyperglycemia, unspecified: Secondary | ICD-10-CM | POA: Diagnosis not present

## 2017-07-27 DIAGNOSIS — F419 Anxiety disorder, unspecified: Secondary | ICD-10-CM | POA: Diagnosis not present

## 2017-07-27 DIAGNOSIS — Z79899 Other long term (current) drug therapy: Secondary | ICD-10-CM | POA: Diagnosis not present

## 2017-07-27 DIAGNOSIS — E7800 Pure hypercholesterolemia, unspecified: Secondary | ICD-10-CM | POA: Diagnosis not present

## 2017-07-27 DIAGNOSIS — R739 Hyperglycemia, unspecified: Secondary | ICD-10-CM | POA: Diagnosis not present

## 2017-07-27 DIAGNOSIS — I10 Essential (primary) hypertension: Secondary | ICD-10-CM | POA: Diagnosis not present

## 2017-08-02 DIAGNOSIS — Z23 Encounter for immunization: Secondary | ICD-10-CM | POA: Diagnosis not present

## 2017-09-22 DIAGNOSIS — R3914 Feeling of incomplete bladder emptying: Secondary | ICD-10-CM | POA: Diagnosis not present

## 2017-09-22 DIAGNOSIS — N401 Enlarged prostate with lower urinary tract symptoms: Secondary | ICD-10-CM | POA: Diagnosis not present

## 2017-11-16 DIAGNOSIS — I10 Essential (primary) hypertension: Secondary | ICD-10-CM | POA: Diagnosis not present

## 2017-11-16 DIAGNOSIS — I480 Paroxysmal atrial fibrillation: Secondary | ICD-10-CM | POA: Diagnosis not present

## 2017-11-16 DIAGNOSIS — I452 Bifascicular block: Secondary | ICD-10-CM | POA: Diagnosis not present

## 2017-12-06 DIAGNOSIS — H353131 Nonexudative age-related macular degeneration, bilateral, early dry stage: Secondary | ICD-10-CM | POA: Diagnosis not present

## 2018-01-06 DIAGNOSIS — L820 Inflamed seborrheic keratosis: Secondary | ICD-10-CM | POA: Diagnosis not present

## 2018-01-30 DIAGNOSIS — F419 Anxiety disorder, unspecified: Secondary | ICD-10-CM | POA: Diagnosis not present

## 2018-01-30 DIAGNOSIS — Z5181 Encounter for therapeutic drug level monitoring: Secondary | ICD-10-CM | POA: Diagnosis not present

## 2018-01-30 DIAGNOSIS — R739 Hyperglycemia, unspecified: Secondary | ICD-10-CM | POA: Diagnosis not present

## 2018-01-30 DIAGNOSIS — Z79899 Other long term (current) drug therapy: Secondary | ICD-10-CM | POA: Diagnosis not present

## 2018-01-30 DIAGNOSIS — I10 Essential (primary) hypertension: Secondary | ICD-10-CM | POA: Diagnosis not present

## 2018-02-06 DIAGNOSIS — L821 Other seborrheic keratosis: Secondary | ICD-10-CM | POA: Diagnosis not present

## 2018-02-06 DIAGNOSIS — E7800 Pure hypercholesterolemia, unspecified: Secondary | ICD-10-CM | POA: Diagnosis not present

## 2018-02-06 DIAGNOSIS — Z125 Encounter for screening for malignant neoplasm of prostate: Secondary | ICD-10-CM | POA: Diagnosis not present

## 2018-02-06 DIAGNOSIS — Z79899 Other long term (current) drug therapy: Secondary | ICD-10-CM | POA: Diagnosis not present

## 2018-02-06 DIAGNOSIS — I10 Essential (primary) hypertension: Secondary | ICD-10-CM | POA: Diagnosis not present

## 2018-02-06 DIAGNOSIS — L720 Epidermal cyst: Secondary | ICD-10-CM | POA: Diagnosis not present

## 2018-02-06 DIAGNOSIS — R739 Hyperglycemia, unspecified: Secondary | ICD-10-CM | POA: Diagnosis not present

## 2018-02-06 DIAGNOSIS — Z0000 Encounter for general adult medical examination without abnormal findings: Secondary | ICD-10-CM | POA: Diagnosis not present

## 2018-02-06 DIAGNOSIS — D649 Anemia, unspecified: Secondary | ICD-10-CM | POA: Diagnosis not present

## 2018-04-08 IMAGING — MR MR LUMBAR SPINE WO/W CM
7 series · 41 of 48 positions shown · IV contrast (multihance)
Comparison: Lumbar radiographs 01/05/2016. Chest radiographs
10/24/2007.

CLINICAL DATA: 86-year-old male with severe pain radiating to the
right hip and upper thigh for 1 month with no known injury. Initial
encounter.

EXAM:
MRI LUMBAR SPINE WITHOUT AND WITH CONTRAST
TECHNIQUE: Multiplanar and multiecho pulse sequences of the lumbar spine were
obtained without and with intravenous contrast.
CONTRAST:  17mL MULTIHANCE GADOBENATE DIMEGLUMINE 529 MG/ML IV SOLN

[Series 3: tirm sag · sagittal · 4.0mm · 0.55mm/px · 4 of 13 slices shown]
[im 1/13]
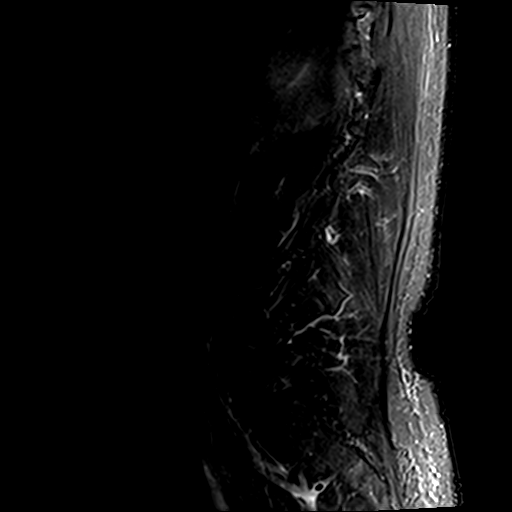
[im 4/13]
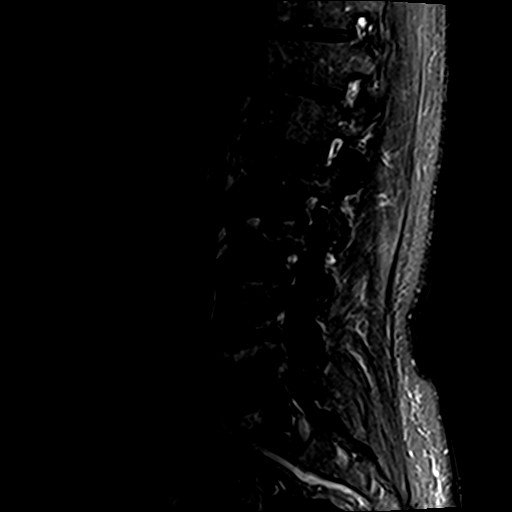
[im 7/13]
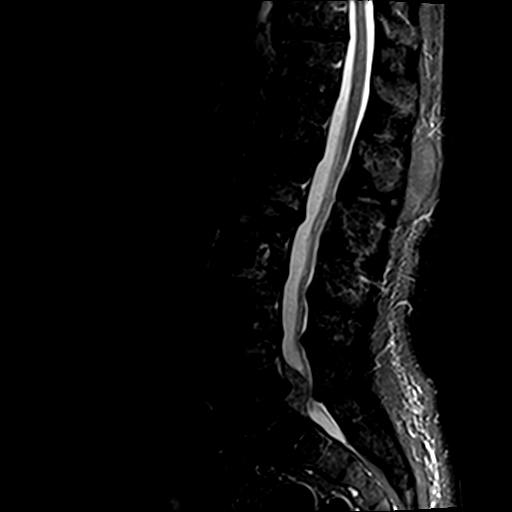
[im 10/13]
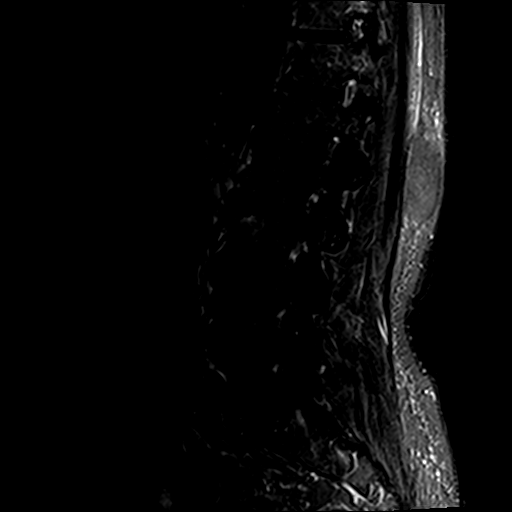

[Series 4: T2 · sagittal · 4.0mm · 0.88mm/px · 5 of 13 slices shown (1 of 2)]
[im 1/13]
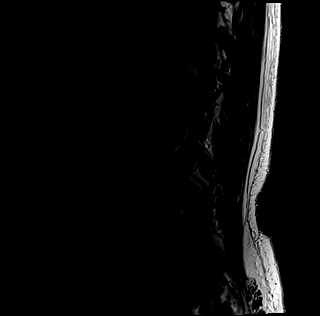
[im 4/13]
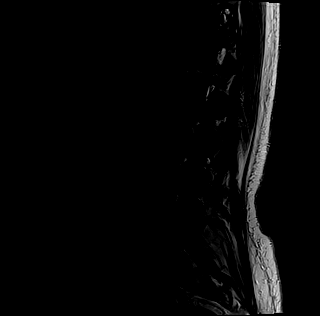
[im 7/13]
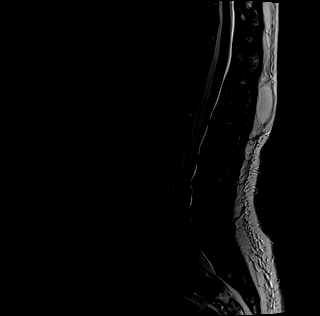
[im 10/13]
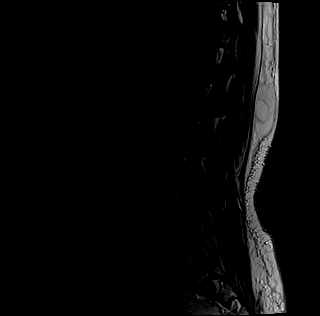
[im 13/13]
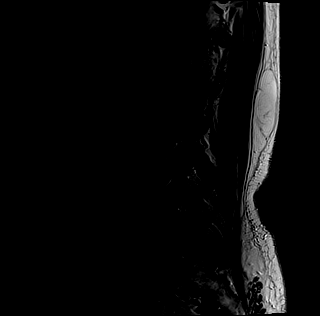

[Series 5: T1 · sagittal · 4.0mm · 0.88mm/px · 4 of 13 slices shown (1 of 2)]
[im 1/13]
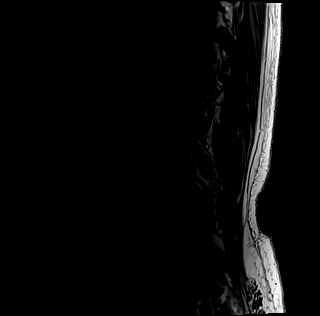
[im 5/13]
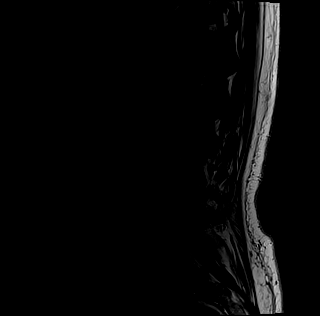
[im 9/13]
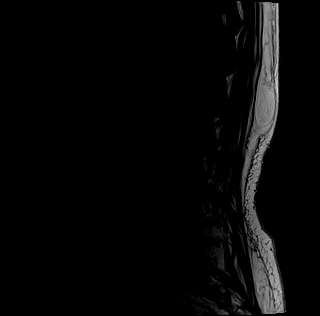
[im 13/13]
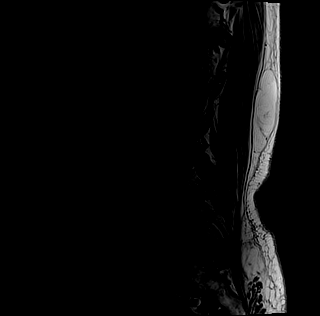

[Series 6: T1 · axial · 4.0mm · 0.70mm/px · z∈[-101,+77]mm · 8 of 32 slices shown (2 of 2)]
[im 1/32]
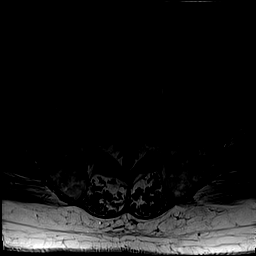
[im 4/32]
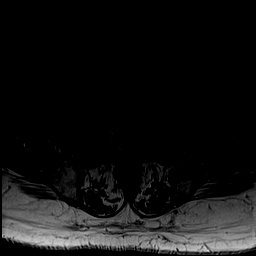
[im 11/32]
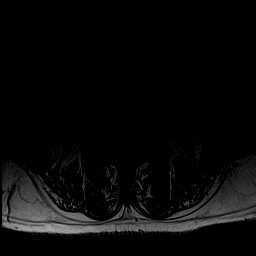
[im 14/32]
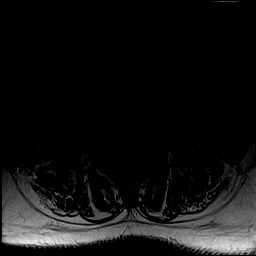
[im 18/32]
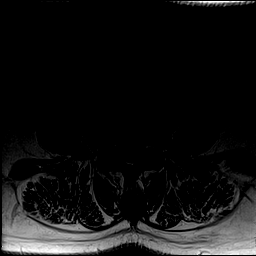
[im 21/32]
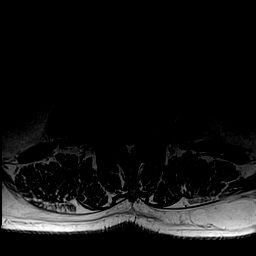
[im 28/32]
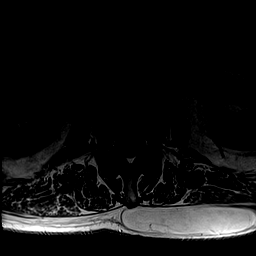
[im 32/32]
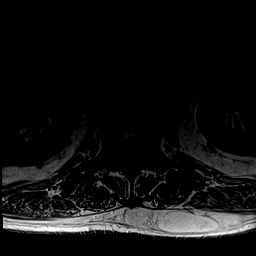

[Series 7: T2 · axial · 4.0mm · 0.70mm/px · z∈[-101,+77]mm · 8 of 32 slices shown (2 of 2)]
[im 1/32]
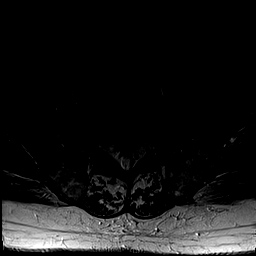
[im 4/32]
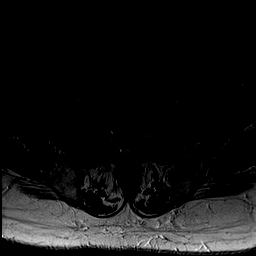
[im 11/32]
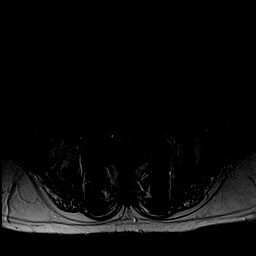
[im 14/32]
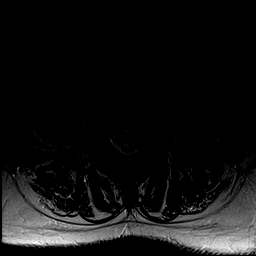
[im 18/32]
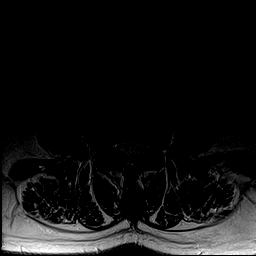
[im 21/32]
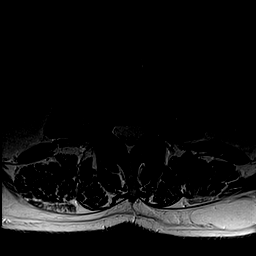
[im 28/32]
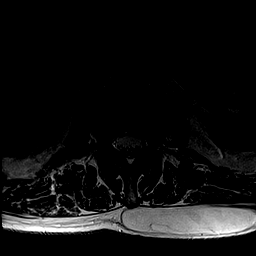
[im 32/32]
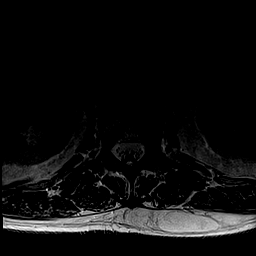

[Series 8: T1 fat-sat post-contrast · sagittal · 4.0mm · 0.88mm/px · 4 of 13 slices shown]
[im 1/13]
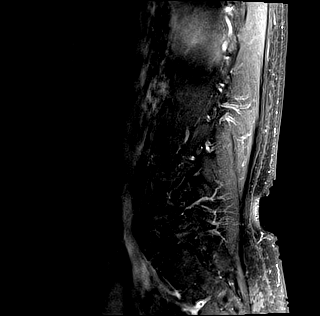
[im 5/13]
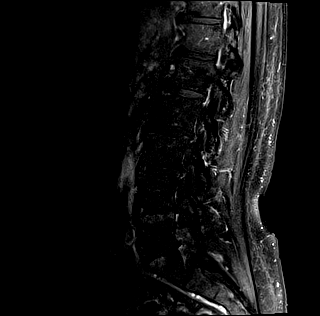
[im 9/13]
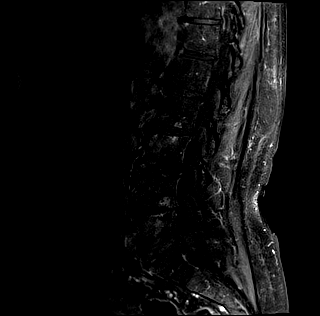
[im 13/13]
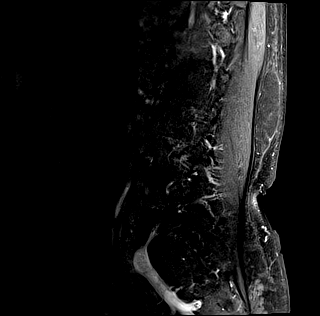

[Series 9: T1 post-contrast · axial · 4.0mm · 0.70mm/px · z∈[-101,+77]mm · 8 of 32 slices shown]
[im 1/32]
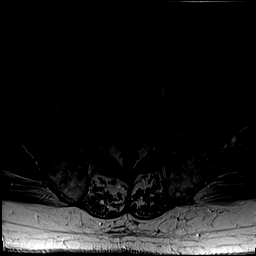
[im 4/32]
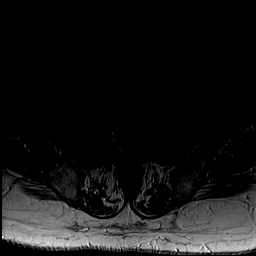
[im 11/32]
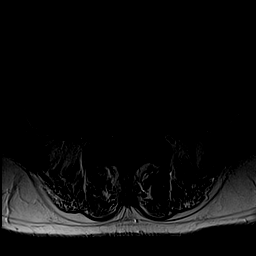
[im 14/32]
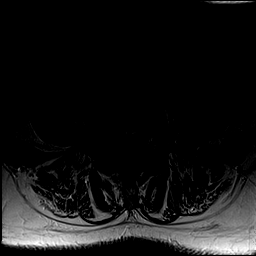
[im 18/32]
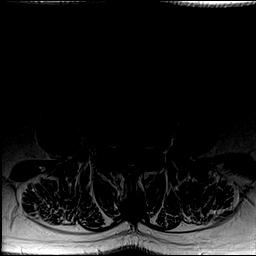
[im 21/32]
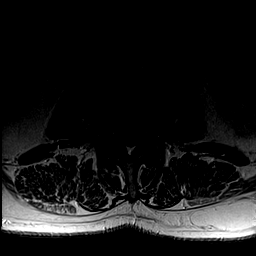
[im 28/32]
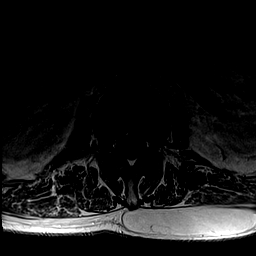
[im 32/32]
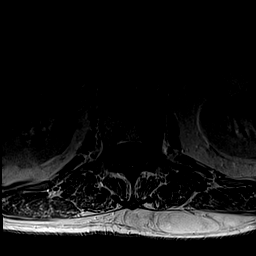

[41 of 48 positions shown; findings below may reference images not displayed]

FINDINGS: 12 pairs of full size ribs demonstrated in 4991. When correlating
this with the recent lumbar radiographs therefore there is normal
lumbar segmentation.

Vertebral height and alignment stable from the [REDACTED] radiographs.
Minimal retrolisthesis at L2-L3 and L5-S1. Superimposed diffuse
heterogeneity of bone marrow signal on all sequences. There are
superimposed chronic degenerative inferior endplate Schmorl nodes at
L2, L3, and L4 which explain some of the patchy marrow edema at
those levels. The superimposed endplate marrow edema at those levels
also appears degenerative in nature. Following contrast, there is
associated similar patchy vertebral enhancement at those levels.
There is overall no destructive osseous lesion identified.

Visualized lower thoracic spinal cord is normal with conus medularis
at L1-L2. No abnormal intradural enhancement.

Negative visualized abdominal viscera.

Negative paraspinal soft tissues; there is evidence of a simple
subcutaneous lipoma to the left of midline at L1-L2 on series 7,
image 4.

T11-T12:  Mild circumferential disc bulge.  No stenosis.

T12-L1:  Negative.

L1-L2:  Mild disc bulge and facet hypertrophy.  No stenosis.

L2-L3: Mild circumferential disc bulge and facet hypertrophy. Mild
left greater than right L2 foraminal stenosis.

L3-L4: Mild disc bulge and endplate spurring. Mild to moderate facet
and ligament flavum hypertrophy. No spinal or lateral recess
stenosis. There is mild bilateral L3 foraminal stenosis.

L4-L5: Disc space loss with mild circumferential disc bulge. Mild
facet and moderate ligament flavum hypertrophy.

L5-S1: L5-S1 disc space loss with circumferential disc bulge
including a prominent central disc protrusion (series 4, image 7).
However, there is a much larger 10 x 17 x 30 mm (AP by transverse by
CC) superimposed space-occupying mass in the ventral epidural space
from the mid L5 level to the mid S1 level. See series 4, image 7.
This has low to intermediate signal on T2, T1, and STIR imaging. IV
contrast was administered to evaluate further, and there is only rim
enhancement of the lesion (series 8, image 6 and series 9, image
29).

Superimposed moderate to severe facet hypertrophy.

Subsequent severe spinal and bilateral lateral recess stenosis.
There is mild to moderate left and moderate right L5 foraminal
stenosis, with some associated deviation of the exiting right L5
nerve on the right due to the epidural lesion.
IMPRESSION: 1. Abnormal L5-S1 level with fairly large ventral epidural mass-like
lesion superimposed on a small central L5-S1 disc herniation.
Post-contrast images were added to evaluate this finding further,
and demonstrate no solid enhancement of the lesion. Therefore, this
is most compatible with a disc herniation with associated Epidural
Hemorrhage.
2. There is subsequent L5-S1 severe spinal, bilateral lateral
recess, and moderate right foraminal stenosis.
3. There is widespread heterogeneity of marrow signal throughout the
visible skeleton, however, no suspicious or destructive osseous
lesion to strongly suggest metastatic disease (which would tend to
change the favored diagnosis for #1).
4. If the patient does not undergo surgery at L5-S1 then recommend
repeat lumbar MRI without and with contrast in 8-12 weeks to
re-evaluate #1.
5. Mild for age lumbar spine degeneration elsewhere.

## 2018-05-08 DIAGNOSIS — D638 Anemia in other chronic diseases classified elsewhere: Secondary | ICD-10-CM | POA: Diagnosis not present

## 2018-05-08 DIAGNOSIS — Z79899 Other long term (current) drug therapy: Secondary | ICD-10-CM | POA: Diagnosis not present

## 2018-05-08 DIAGNOSIS — D649 Anemia, unspecified: Secondary | ICD-10-CM | POA: Diagnosis not present

## 2018-05-08 DIAGNOSIS — Z125 Encounter for screening for malignant neoplasm of prostate: Secondary | ICD-10-CM | POA: Diagnosis not present

## 2018-05-08 DIAGNOSIS — I10 Essential (primary) hypertension: Secondary | ICD-10-CM | POA: Diagnosis not present

## 2018-05-08 DIAGNOSIS — R739 Hyperglycemia, unspecified: Secondary | ICD-10-CM | POA: Diagnosis not present

## 2018-05-15 DIAGNOSIS — E7800 Pure hypercholesterolemia, unspecified: Secondary | ICD-10-CM | POA: Diagnosis not present

## 2018-05-15 DIAGNOSIS — I10 Essential (primary) hypertension: Secondary | ICD-10-CM | POA: Diagnosis not present

## 2018-05-15 DIAGNOSIS — Z0000 Encounter for general adult medical examination without abnormal findings: Secondary | ICD-10-CM | POA: Diagnosis not present

## 2018-05-15 DIAGNOSIS — Z79899 Other long term (current) drug therapy: Secondary | ICD-10-CM | POA: Diagnosis not present

## 2018-05-15 DIAGNOSIS — R739 Hyperglycemia, unspecified: Secondary | ICD-10-CM | POA: Diagnosis not present

## 2018-05-18 DIAGNOSIS — I480 Paroxysmal atrial fibrillation: Secondary | ICD-10-CM | POA: Diagnosis not present

## 2018-05-18 DIAGNOSIS — I10 Essential (primary) hypertension: Secondary | ICD-10-CM | POA: Diagnosis not present

## 2018-05-18 DIAGNOSIS — I452 Bifascicular block: Secondary | ICD-10-CM | POA: Diagnosis not present

## 2018-07-14 DIAGNOSIS — Z23 Encounter for immunization: Secondary | ICD-10-CM | POA: Diagnosis not present

## 2018-10-17 DIAGNOSIS — Z125 Encounter for screening for malignant neoplasm of prostate: Secondary | ICD-10-CM | POA: Diagnosis not present

## 2018-10-17 DIAGNOSIS — N400 Enlarged prostate without lower urinary tract symptoms: Secondary | ICD-10-CM | POA: Diagnosis not present

## 2018-11-03 DIAGNOSIS — E7800 Pure hypercholesterolemia, unspecified: Secondary | ICD-10-CM | POA: Diagnosis not present

## 2018-11-03 DIAGNOSIS — F419 Anxiety disorder, unspecified: Secondary | ICD-10-CM | POA: Diagnosis not present

## 2018-11-03 DIAGNOSIS — I10 Essential (primary) hypertension: Secondary | ICD-10-CM | POA: Diagnosis not present

## 2018-11-22 ENCOUNTER — Ambulatory Visit (INDEPENDENT_AMBULATORY_CARE_PROVIDER_SITE_OTHER): Payer: Medicare Other | Admitting: Cardiology

## 2018-11-22 ENCOUNTER — Encounter: Payer: Self-pay | Admitting: Cardiology

## 2018-11-22 VITALS — BP 119/64 | HR 65 | Ht 71.0 in | Wt 197.0 lb

## 2018-11-22 DIAGNOSIS — I10 Essential (primary) hypertension: Secondary | ICD-10-CM

## 2018-11-22 DIAGNOSIS — I480 Paroxysmal atrial fibrillation: Secondary | ICD-10-CM

## 2018-11-22 DIAGNOSIS — E785 Hyperlipidemia, unspecified: Secondary | ICD-10-CM | POA: Diagnosis not present

## 2018-11-22 NOTE — Progress Notes (Signed)
Subjective:  Primary Physician:  Jani Gravel, MD  Patient ID: Sean Lindsey, male    DOB: 09-30-1930, 85 y.o.   MRN: [id number]  Chief Complaint  Patient presents with  . Atrial Fibrillation    6 month F/U    HPI: Sean Lindsey  is a 85 y.o. male  with Mr. Josie Mesa is a young looking 85 year old Caucasian male with  paroxysmal atrial fibrillation, presents here for 6 month office visit.  He is presently doing well. Has chronic leg edema  and he wears support stockings for the same he has no other specific complaints.  No bleeding diathesis.  States that his labs including renal function and blood counts have been stable per recent complete physical by Dr. Jani Gravel.  He has history of hyperglycemia, hyperlipidemia, and hypertension.  He underwent echocardiogram and stress testing on March 2018 essentially revealing no evidence of ischemia and normal LVEF, moderate left atrial enlargement.  Patients remains active and walks daily.    Past Medical History:  Diagnosis Date  . Anxiety   . BPH (benign prostatic hyperplasia)   . Hypercholesterolemia   . Hyperglycemia   . Hyperlipidemia   . Hyperplastic colon polyp   . Hypertension    Benign  . Internal hemorrhoids   . Lipoma    Near cecum  . Lung nodules 11/28/2010   History of  . Other abnormal glucose   . PSA elevation     Past Surgical History:  Procedure Laterality Date  . Left thyroid nodule     FNA    Social History   Socioeconomic History  . Marital status: Married    Spouse name: Not on file  . Number of children: 2  . Years of education: Not on file  . Highest education level: Not on file  Occupational History  . Occupation: Retired  Scientific laboratory technician  . Financial resource strain: Not on file  . Food insecurity:    Worry: Not on file    Inability: Not on file  . Transportation needs:    Medical: Not on file    Non-medical: Not on file  Tobacco Use  . Smoking status: Former Smoker    Packs/day: 1.00     Years: 20.00    Pack years: 20.00    Types: Cigarettes    Last attempt to quit: 10/11/1984    Years since quitting: 34.1  . Smokeless tobacco: Never Used  Substance and Sexual Activity  . Alcohol use: Yes    Comment: occ. wine  . Drug use: No  . Sexual activity: Not on file  Lifestyle  . Physical activity:    Days per week: Not on file    Minutes per session: Not on file  . Stress: Not on file  Relationships  . Social connections:    Talks on phone: Not on file    Gets together: Not on file    Attends religious service: Not on file    Active member of club or organization: Not on file    Attends meetings of clubs or organizations: Not on file    Relationship status: Not on file  . Intimate partner violence:    Fear of current or ex partner: Not on file    Emotionally abused: Not on file    Physically abused: Not on file    Forced sexual activity: Not on file  Other Topics Concern  . Not on file  Social History Narrative  High risk of falls, patient has had 1 fall with injury within the last year.    Current Outpatient Medications on File Prior to Visit  Medication Sig Dispense Refill  . ALPRAZolam (XANAX) 0.25 MG tablet Take 0.25 mg by mouth 3 (three) times daily as needed.    Marland Kitchen amLODipine (NORVASC) 10 MG tablet Take 10 mg by mouth daily.    Marland Kitchen atorvastatin (LIPITOR) 20 MG tablet Take 20 mg by mouth daily.    . Cholecalciferol (VITAMIN D) 2000 UNITS CAPS Take by mouth. Reported on 12/30/2015    . dutasteride (AVODART) 0.5 MG capsule Take 0.5 mg by mouth daily.    . finasteride (PROSCAR) 5 MG tablet Take 5 mg by mouth daily.    . metoprolol tartrate (LOPRESSOR) 50 MG tablet Take 1 tablet by mouth 2 (two) times daily.    Marland Kitchen olmesartan-hydrochlorothiazide (BENICAR HCT) 40-25 MG per tablet Take 1 tablet by mouth daily.    Marland Kitchen spironolactone (ALDACTONE) 25 MG tablet Take 25 mg by mouth daily.    Alveda Reasons 20 MG TABS tablet Take 1 tablet by mouth daily.    Marland Kitchen aspirin 81 MG  tablet Take 81 mg by mouth daily.    . cyclobenzaprine (FLEXERIL) 5 MG tablet 1 pill by mouth up to every 8 hours as needed. Start with one pill by mouth each bedtime as needed due to sedation (Patient not taking: Reported on 01/20/2016) 15 tablet 0  . ibuprofen (ADVIL,MOTRIN) 200 MG tablet Take 200 mg by mouth every 6 (six) hours as needed for moderate pain.    . silodosin (RAPAFLO) 8 MG CAPS capsule Take 8 mg by mouth daily with breakfast.      No current facility-administered medications on file prior to visit.     Review of Systems  Constitutional: Negative for malaise/fatigue and weight loss.  Respiratory: Negative for cough, hemoptysis and shortness of breath.   Cardiovascular: Positive for leg swelling (chronic). Negative for chest pain, palpitations and claudication.  Gastrointestinal: Negative for abdominal pain, blood in stool, constipation, heartburn and vomiting.  Genitourinary: Negative for dysuria.  Musculoskeletal: Negative for joint pain and myalgias.  Neurological: Negative for dizziness, focal weakness and headaches.  Endo/Heme/Allergies: Does not bruise/bleed easily.  Psychiatric/Behavioral: Negative for depression. The patient is not nervous/anxious.   All other systems reviewed and are negative.     Objective:  Blood pressure 119/64, pulse 65, height 5\' 11"  (1.803 m), weight 197 lb (89.4 kg), SpO2 96 %.  Physical Exam  Constitutional: He appears well-developed and well-nourished. No distress.  HENT:  Head: Atraumatic.  Eyes: Conjunctivae are normal.  Neck: Neck supple. No JVD present. No thyromegaly present.  Cardiovascular: Normal rate, regular rhythm and normal heart sounds. Exam reveals no gallop.  No murmur heard. Due to pedal edema, pedal pulse difficult to feel.   Pulmonary/Chest: Effort normal and breath sounds normal.  Abdominal: Soft. Bowel sounds are normal.  Musculoskeletal: Normal range of motion.        General: Edema (2 + pitting) present.    Neurological: He is alert.  Skin: Skin is warm and dry.  Psychiatric: He has a normal mood and affect.   Assessment & Recommendations:   1. Paroxysmal atrial fibrillation CHA2DS2-VASc Score is 3.  -(CHF; HTN; vasc disease DM,  Male = 1; Age <65 =0; 65-74 = 1,  >75 =2; stroke = 2).  -(Yearly risk of stroke: Score of 1=1.3; 2=2.2; 3=3.2; 4=4; 5=6.7; 6=9.8; 7=>9.8)  EKG 11/22/2018: Normal sinus rhythm at  rate of 72 bpm, left atrial enlargement, left axis deviation, left and to physical block.  Right bundle branch block.  Bifascicular block. PAC  2. Essential hypertension  4. Mild hyperlipidemia   Recommendation: He is presently maintaining sinus rhythm. Lipids are controlled and patient has has had recent labs and states that his blood count and renal function is stable and lipids are controlled.   Follow up in 6 months.   Adrian Prows, MD, [facility] 11/22/2018, 3:08 PM [facility] [place] Pager: [phone] Office: [phone] If no answer Cell [phone]

## 2018-12-11 ENCOUNTER — Other Ambulatory Visit: Payer: Self-pay

## 2018-12-11 ENCOUNTER — Emergency Department (HOSPITAL_COMMUNITY): Payer: No Typology Code available for payment source

## 2018-12-11 ENCOUNTER — Encounter (HOSPITAL_COMMUNITY): Payer: Self-pay | Admitting: Emergency Medicine

## 2018-12-11 ENCOUNTER — Inpatient Hospital Stay (HOSPITAL_COMMUNITY)
Admission: EM | Admit: 2018-12-11 | Discharge: 2018-12-13 | DRG: 084 | Disposition: A | Discharge status: Home / Self Care | Payer: No Typology Code available for payment source | Attending: Internal Medicine | Admitting: Internal Medicine

## 2018-12-11 DIAGNOSIS — E041 Nontoxic single thyroid nodule: Secondary | ICD-10-CM | POA: Diagnosis not present

## 2018-12-11 DIAGNOSIS — K5730 Diverticulosis of large intestine without perforation or abscess without bleeding: Secondary | ICD-10-CM | POA: Diagnosis present

## 2018-12-11 DIAGNOSIS — I10 Essential (primary) hypertension: Secondary | ICD-10-CM | POA: Diagnosis present

## 2018-12-11 DIAGNOSIS — Z806 Family history of leukemia: Secondary | ICD-10-CM

## 2018-12-11 DIAGNOSIS — F419 Anxiety disorder, unspecified: Secondary | ICD-10-CM | POA: Diagnosis present

## 2018-12-11 DIAGNOSIS — R55 Syncope and collapse: Secondary | ICD-10-CM

## 2018-12-11 DIAGNOSIS — Z841 Family history of disorders of kidney and ureter: Secondary | ICD-10-CM

## 2018-12-11 DIAGNOSIS — I700 Atherosclerosis of aorta: Secondary | ICD-10-CM | POA: Diagnosis present

## 2018-12-11 DIAGNOSIS — S065X9A Traumatic subdural hemorrhage with loss of consciousness of unspecified duration, initial encounter: Secondary | ICD-10-CM | POA: Diagnosis not present

## 2018-12-11 DIAGNOSIS — E785 Hyperlipidemia, unspecified: Secondary | ICD-10-CM | POA: Diagnosis present

## 2018-12-11 DIAGNOSIS — S299XXA Unspecified injury of thorax, initial encounter: Secondary | ICD-10-CM | POA: Diagnosis not present

## 2018-12-11 DIAGNOSIS — E7800 Pure hypercholesterolemia, unspecified: Secondary | ICD-10-CM | POA: Diagnosis present

## 2018-12-11 DIAGNOSIS — Z888 Allergy status to other drugs, medicaments and biological substances status: Secondary | ICD-10-CM

## 2018-12-11 DIAGNOSIS — S0993XA Unspecified injury of face, initial encounter: Secondary | ICD-10-CM | POA: Diagnosis not present

## 2018-12-11 DIAGNOSIS — N400 Enlarged prostate without lower urinary tract symptoms: Secondary | ICD-10-CM | POA: Diagnosis not present

## 2018-12-11 DIAGNOSIS — S3991XA Unspecified injury of abdomen, initial encounter: Secondary | ICD-10-CM | POA: Diagnosis not present

## 2018-12-11 DIAGNOSIS — Z800 Family history of malignant neoplasm of digestive organs: Secondary | ICD-10-CM

## 2018-12-11 DIAGNOSIS — Z7982 Long term (current) use of aspirin: Secondary | ICD-10-CM

## 2018-12-11 DIAGNOSIS — Z7901 Long term (current) use of anticoagulants: Secondary | ICD-10-CM | POA: Diagnosis not present

## 2018-12-11 DIAGNOSIS — Z87891 Personal history of nicotine dependence: Secondary | ICD-10-CM

## 2018-12-11 DIAGNOSIS — J449 Chronic obstructive pulmonary disease, unspecified: Secondary | ICD-10-CM | POA: Diagnosis present

## 2018-12-11 DIAGNOSIS — I480 Paroxysmal atrial fibrillation: Secondary | ICD-10-CM | POA: Diagnosis present

## 2018-12-11 DIAGNOSIS — I4891 Unspecified atrial fibrillation: Secondary | ICD-10-CM | POA: Diagnosis present

## 2018-12-11 DIAGNOSIS — S199XXA Unspecified injury of neck, initial encounter: Secondary | ICD-10-CM | POA: Diagnosis not present

## 2018-12-11 DIAGNOSIS — S065X0A Traumatic subdural hemorrhage without loss of consciousness, initial encounter: Secondary | ICD-10-CM | POA: Diagnosis not present

## 2018-12-11 DIAGNOSIS — S0990XA Unspecified injury of head, initial encounter: Secondary | ICD-10-CM | POA: Diagnosis not present

## 2018-12-11 DIAGNOSIS — Z8249 Family history of ischemic heart disease and other diseases of the circulatory system: Secondary | ICD-10-CM

## 2018-12-11 DIAGNOSIS — Z807 Family history of other malignant neoplasms of lymphoid, hematopoietic and related tissues: Secondary | ICD-10-CM

## 2018-12-11 DIAGNOSIS — K760 Fatty (change of) liver, not elsewhere classified: Secondary | ICD-10-CM | POA: Diagnosis present

## 2018-12-11 DIAGNOSIS — S3993XA Unspecified injury of pelvis, initial encounter: Secondary | ICD-10-CM | POA: Diagnosis not present

## 2018-12-11 DIAGNOSIS — S065XAA Traumatic subdural hemorrhage with loss of consciousness status unknown, initial encounter: Secondary | ICD-10-CM | POA: Diagnosis present

## 2018-12-11 DIAGNOSIS — E042 Nontoxic multinodular goiter: Secondary | ICD-10-CM | POA: Diagnosis present

## 2018-12-11 LAB — CBC
HCT: 41.3 % (ref 39.0–52.0)
Hemoglobin: 14 g/dL (ref 13.0–17.0)
MCH: 30.4 pg (ref 26.0–34.0)
MCHC: 33.9 g/dL (ref 30.0–36.0)
MCV: 89.6 fL (ref 80.0–100.0)
Platelets: 297 10*3/uL (ref 150–400)
RBC: 4.61 MIL/uL (ref 4.22–5.81)
RDW: 12.1 % (ref 11.5–15.5)
WBC: 14.2 10*3/uL — ABNORMAL HIGH (ref 4.0–10.5)
nRBC: 0 % (ref 0.0–0.2)

## 2018-12-11 LAB — URINALYSIS, ROUTINE W REFLEX MICROSCOPIC
Bilirubin Urine: NEGATIVE
Glucose, UA: NEGATIVE mg/dL
Hgb urine dipstick: NEGATIVE
Ketones, ur: NEGATIVE mg/dL
Leukocytes,Ua: NEGATIVE
Nitrite: NEGATIVE
PROTEIN: NEGATIVE mg/dL
Specific Gravity, Urine: 1.012 (ref 1.005–1.030)
pH: 6 (ref 5.0–8.0)

## 2018-12-11 LAB — PROTIME-INR
INR: 1.7 — ABNORMAL HIGH (ref 0.8–1.2)
INR: 1.3 — ABNORMAL HIGH (ref 0.8–1.2)
Prothrombin Time: 19.7 seconds — ABNORMAL HIGH (ref 11.4–15.2)
Prothrombin Time: 15.8 seconds — ABNORMAL HIGH (ref 11.4–15.2)

## 2018-12-11 LAB — COMPREHENSIVE METABOLIC PANEL
ALBUMIN: 3.6 g/dL (ref 3.5–5.0)
ALK PHOS: 63 U/L (ref 38–126)
ALT: 29 U/L (ref 0–44)
AST: 31 U/L (ref 15–41)
Anion gap: 11 (ref 5–15)
BUN: 30 mg/dL — ABNORMAL HIGH (ref 8–23)
CO2: 21 mmol/L — ABNORMAL LOW (ref 22–32)
Calcium: 8.6 mg/dL — ABNORMAL LOW (ref 8.9–10.3)
Chloride: 100 mmol/L (ref 98–111)
Creatinine, Ser: 1.26 mg/dL — ABNORMAL HIGH (ref 0.61–1.24)
GFR calc Af Amer: 59 mL/min — ABNORMAL LOW (ref >60)
GFR calc non Af Amer: 51 mL/min — ABNORMAL LOW (ref >60)
GLUCOSE: 141 mg/dL — AB (ref 70–99)
Potassium: 4.4 mmol/L (ref 3.5–5.1)
Sodium: 132 mmol/L — ABNORMAL LOW (ref 135–145)
Total Bilirubin: 0.6 mg/dL (ref 0.3–1.2)
Total Protein: 6.4 g/dL — ABNORMAL LOW (ref 6.5–8.1)

## 2018-12-11 LAB — SAMPLE TO BLOOD BANK

## 2018-12-11 LAB — TYPE AND SCREEN
ABO/RH(D): O NEG
ANTIBODY SCREEN: NEGATIVE

## 2018-12-11 LAB — ETHANOL

## 2018-12-11 LAB — APTT: aPTT: 43 seconds — ABNORMAL HIGH (ref 24–36)

## 2018-12-11 LAB — LACTIC ACID, PLASMA: Lactic Acid, Venous: 1.5 mmol/L (ref 0.5–1.9)

## 2018-12-11 LAB — ABO/RH: ABO/RH(D): O NEG

## 2018-12-11 MED ORDER — SPIRONOLACTONE 25 MG PO TABS
25.0000 mg | ORAL_TABLET | Freq: Every day | ORAL | Status: DC
  Administered 2018-12-12 – 2018-12-13 (×2): 25 mg via ORAL
  Filled 2018-12-11 (×2): qty 1

## 2018-12-11 MED ORDER — ONDANSETRON HCL 4 MG/2ML IJ SOLN: 4.0000 mg | Freq: Four times a day (QID) | INTRAMUSCULAR | Status: DC | PRN

## 2018-12-11 MED ORDER — METOPROLOL TARTRATE 50 MG PO TABS
50.0000 mg | ORAL_TABLET | Freq: Two times a day (BID) | ORAL | Status: DC
  Administered 2018-12-11 – 2018-12-13 (×3): 50 mg via ORAL
  Filled 2018-12-11 (×3): qty 1

## 2018-12-11 MED ORDER — AMLODIPINE BESYLATE 10 MG PO TABS
10.0000 mg | ORAL_TABLET | Freq: Every day | ORAL | Status: DC
  Administered 2018-12-12 – 2018-12-13 (×2): 10 mg via ORAL
  Filled 2018-12-11 (×2): qty 1

## 2018-12-11 MED ORDER — FINASTERIDE 5 MG PO TABS
5.0000 mg | ORAL_TABLET | Freq: Every day | ORAL | Status: DC
  Administered 2018-12-12 – 2018-12-13 (×2): 5 mg via ORAL
  Filled 2018-12-11 (×2): qty 1

## 2018-12-11 MED ORDER — ALPRAZOLAM 0.25 MG PO TABS
0.2500 mg | ORAL_TABLET | Freq: Every day | ORAL | Status: DC | PRN
  Administered 2018-12-12 (×2): 0.25 mg via ORAL
  Filled 2018-12-11 (×2): qty 1

## 2018-12-11 MED ORDER — HYDROCHLOROTHIAZIDE 25 MG PO TABS
25.0000 mg | ORAL_TABLET | Freq: Every day | ORAL | Status: DC
  Administered 2018-12-12 – 2018-12-13 (×2): 25 mg via ORAL
  Filled 2018-12-11 (×2): qty 1

## 2018-12-11 MED ORDER — ATORVASTATIN CALCIUM 10 MG PO TABS
20.0000 mg | ORAL_TABLET | Freq: Every day | ORAL | Status: DC
  Administered 2018-12-12 – 2018-12-13 (×2): 20 mg via ORAL
  Filled 2018-12-11 (×2): qty 2

## 2018-12-11 MED ORDER — IOHEXOL 300 MG/ML  SOLN
100.0000 mL | Freq: Once | INTRAMUSCULAR | Status: AC | PRN
  Administered 2018-12-11: 100 mL via INTRAVENOUS

## 2018-12-11 MED ORDER — PROTHROMBIN COMPLEX CONC HUMAN 500 UNITS IV KIT
4436.0000 [IU] | PACK | Status: AC
  Administered 2018-12-11: 4436 [IU] via INTRAVENOUS
  Filled 2018-12-11: qty 4436

## 2018-12-11 MED ORDER — SILODOSIN 8 MG PO CAPS
8.0000 mg | ORAL_CAPSULE | Freq: Every day | ORAL | Status: DC
  Administered 2018-12-12 – 2018-12-13 (×2): 8 mg via ORAL
  Filled 2018-12-11 (×3): qty 1

## 2018-12-11 MED ORDER — IRBESARTAN 300 MG PO TABS
300.0000 mg | ORAL_TABLET | Freq: Every day | ORAL | Status: DC
  Administered 2018-12-12 – 2018-12-13 (×2): 300 mg via ORAL
  Filled 2018-12-11 (×2): qty 1

## 2018-12-11 MED ORDER — ACETAMINOPHEN 325 MG PO TABS: 650.0000 mg | ORAL_TABLET | ORAL | Status: DC | PRN

## 2018-12-11 MED ORDER — ONDANSETRON HCL 4 MG PO TABS: 4.0000 mg | ORAL_TABLET | Freq: Four times a day (QID) | ORAL | Status: DC | PRN

## 2018-12-11 MED ORDER — OLMESARTAN MEDOXOMIL-HCTZ 40-25 MG PO TABS: 1.0000 | ORAL_TABLET | Freq: Every day | ORAL | Status: DC

## 2018-12-11 NOTE — ED Notes (Signed)
Pt received wound care for skin tears on bilateral hands.

## 2018-12-11 NOTE — ED Notes (Signed)
Patient transported to CT 
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
## 2018-12-11 NOTE — H&P (Signed)
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 anatomic. Skull base and vertebrae: Craniocervical junction is normal. Vertebral body heights are maintained. No acute fractures are present. Soft tissues and spinal canal: Atherosclerotic calcifications are present at the carotid bifurcations. A multinodular goiter is present. There is no dominant lesion. No mucosal or submucosal lesions are present. Salivary glands are normal. Hemorrhage is evident within the spinal canal. Disc levels: Asymmetric left-sided uncovertebral and endplate disease is present. Osseous foraminal narrowing is noted at C3-4 and C4-5 in particular. Foraminal narrowing is worse on the left at these levels. Upper chest: There is scarring at the lung apices bilaterally. Centrilobular emphysematous changes are noted. IMPRESSION: 1. Left subdural collection. This is age indeterminate. It may be acute. There is no significant midline shift or mass effect given the overall degree of atrophy. 2. Moderate atrophy and white matter disease is otherwise  within normal limits for age. 3. Soft tissue swelling along the left side of the face without underlying facial fracture. 4. Multilevel degenerative changes within the cervical spine, left greater than right. Left foraminal narrowing is more prominent than right foraminal narrowing. 5. No acute fracture or traumatic subluxation in the cervical spine. Critical Value/emergent results were called by telephone at the time of interpretation on 12/11/2018 at 8:00 pm to Dr. Julianne Rice , who verbally acknowledged these results. Electronically Signed   By: San Morelle M.D.   On: 12/11/2018 20:04    EKG: Independently reviewed.  Assessment/Plan Principal Problem:   SDH (subdural hematoma) (HCC) Active Problems:   A-fib (HCC)   HTN (hypertension)   Thyroid nodule    1. SDH - 1. Mild TBI pathway 2. Q4H neuro checks 3. Reversing xarelto with KCentra 4. Repeat CT head in AM 2. ? Syncope - 1. Tele monitor 2. 2d echo 3. A.Fib - 1. Hold Xarelto and reverse 2. Continue other home meds 4. HTN - continue home BP meds 5. Thyroid nodule - 1. Worse looking on CT today when compared with 10 years ago apparently 2. Biopsy x10 years ago was benign 3. Call Gen surg in AM and see if any further work up recommended or if biopsy x10 years ago was enough.  DVT prophylaxis: SCDs Code Status: Full Family Communication: Family at bedside Disposition Plan: Home  after admit Consults called: NS Admission status: Place in obs    [place], [place] [facility]  How to contact the [facility] Attending or Consulting provider [place] or covering provider during after hours [place], for this patient?  1. Check the care team in [facility] and look for a) attending/consulting [facility] provider listed and b) the [facility] team listed 2. Log into [email or web address]  Amion Physician Scheduling and messaging for groups and whole hospitals  On call and physician scheduling software for group practices, residents, hospitalists  and other medical providers for call, clinic, rotation and shift schedules. On[employer] is a hospital-wide system for scheduling doctors and paging doctors on call. EasyPlot is for scientific plotting and data analysis.  [email or web address]  and use [facility]'s universal password to access. If you do not have the password, please contact the hospital operator.  3. Locate the [facility] provider you are looking for under [facility] and page to a number that you can be directly reached. 4. If you still have difficulty reaching the provider, please page the [facility] (Director on Call) for the Hospitalists listed on amion for assistance.  12/11/2018, 9:12 PM

## 2018-12-11 NOTE — ED Notes (Signed)
Hospitalist at the bedside discussing plan, daughter present.

## 2018-12-11 NOTE — Progress Notes (Signed)
Pt arrived from ED. Pt lives at home with wife. Pt is A&Ox4. Pt placed on telemetry #27. Pt oriented to room. Pt told to call for assistance, pt stated understanding. Pt's left hand, left fourth finger, right forearm, rt ear, left leg all have abrasions gauze dressings applied. Will continue to monitor pt. Ranelle Oyster, RN
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
## 2018-12-11 NOTE — ED Notes (Signed)
RN informed Pt can receive visitor  

## 2018-12-11 NOTE — ED Notes (Signed)
Pt called out to use bathroom. Pt given urinal and instructed to use call light when finished as Pt stated he did not want this tech to stay in the room with him.

## 2018-12-11 NOTE — Progress Notes (Signed)
Received report from ED.  
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
## 2018-12-11 NOTE — ED Notes (Signed)
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 carotid bifurcations. A multinodular goiter is present. There is no dominant lesion. No mucosal or submucosal lesions are present. Salivary glands are normal. Hemorrhage is evident within the spinal canal. Disc levels: Asymmetric left-sided uncovertebral and endplate disease is present. Osseous foraminal narrowing is noted at C3-4 and C4-5 in particular. Foraminal narrowing is worse on the left at these levels. Upper chest: There is scarring at the lung apices bilaterally. Centrilobular emphysematous changes are noted. IMPRESSION: 1. Left subdural collection. This is age indeterminate. It may be acute. There is no significant midline shift or mass effect given the overall degree of atrophy. 2. Moderate atrophy and white matter disease is otherwise within normal limits for age. 3. Soft tissue swelling along the left side of the face without underlying facial fracture. 4. Multilevel degenerative changes within the cervical spine, left greater than right. Left foraminal narrowing is more prominent than right foraminal narrowing.  5. No acute fracture or traumatic subluxation in the cervical spine. Critical Value/emergent results were called by telephone at the time of interpretation on 12/11/2018 at 8:00 pm to Dr. Julianne Rice , who verbally acknowledged these results. Electronically Signed   By: San Morelle M.D.   On: 12/11/2018 20:04    Pending Labs Unresulted Labs (From admission, onward)    Start     Ordered   12/11/18 [place],   R     12/11/18 2105   12/11/18 2056  TSH  Once,   R     12/11/18 2055   12/11/18 1658  Type and  screen [place]  ONCE - STAT,   STAT    Comments:  [place]    12/11/18 1659   12/11/18 1657  CDS serology  (Trauma Panel)  Once,   STAT     12/11/18 1659          Vitals/Pain Today's Vitals   12/11/18 2012 12/11/18 2053 12/11/18 2100 12/11/18 2130  BP:  (!) 145/58 (!) 152/70 (!) 147/69  Pulse:  70 68 72  Resp:  16 16 20   Temp:      TempSrc:      SpO2:  99% 98% 100%  Weight: 89 kg     PainSc: 0-No pain       Isolation Precautions No active isolations  Medications Medications  acetaminophen (TYLENOL) tablet 650 mg (has no administration in time range)  ondansetron (ZOFRAN) tablet 4 mg (has no administration in time range)    Or  ondansetron (ZOFRAN) injection 4 mg (has no administration in time range)  ALPRAZolam (XANAX) tablet 0.25 mg (has no administration in time range)  amLODipine (NORVASC) tablet 10 mg (has no administration in time range)  atorvastatin (LIPITOR) tablet 20 mg (has no administration in time range)  finasteride (PROSCAR) tablet 5 mg (has no administration in time range)  metoprolol tartrate (LOPRESSOR) tablet 50 mg (has no administration in time range)  olmesartan-hydrochlorothiazide (BENICAR HCT) 40-25 MG per tablet 1 tablet (has no administration in time range)  spironolactone (ALDACTONE) tablet 25 mg (has no administration in time range)  silodosin (RAPAFLO) capsule 8 mg (has  no administration in time range)  iohexol (OMNIPAQUE) 300 MG/ML solution 100 mL (100 mLs Intravenous Contrast Given 12/11/18 1942)  prothrombin complex conc human (KCENTRA) IVPB 4,436 Units (4,436 Units Intravenous New Bag/Given 12/11/18 2056)    Mobility walks High fall risk   Focused Assessments Neuro Assessment Handoff:  Swallow screen pass? yes Cardiac Rhythm: Atrial fibrillation       Neuro Assessment: Exceptions to WDL Neuro Checks:      Last Documented NIHSS Modified Score:   Has TPA been given? Yes Temp: 98 F (36.7 C) (03/02 1631) Temp Source: Oral (03/02 1631) BP: 147/69 (03/02 2130) Pulse Rate: 72 (03/02 2130) If patient is a Neuro Trauma and patient is going to OR before floor call report to [facility] nurse: [phone] or [phone]     R Recommendations: See Admitting Provider Note  Report given to:   Additional Notes: [place] completed prior to admission .
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
## 2018-12-11 NOTE — ED Notes (Signed)
ED Provider at bedside. 

## 2018-12-12 ENCOUNTER — Observation Stay (HOSPITAL_COMMUNITY): Payer: No Typology Code available for payment source

## 2018-12-12 ENCOUNTER — Ambulatory Visit (HOSPITAL_COMMUNITY): Payer: No Typology Code available for payment source

## 2018-12-12 DIAGNOSIS — Z807 Family history of other malignant neoplasms of lymphoid, hematopoietic and related tissues: Secondary | ICD-10-CM | POA: Diagnosis not present

## 2018-12-12 DIAGNOSIS — Z841 Family history of disorders of kidney and ureter: Secondary | ICD-10-CM | POA: Diagnosis not present

## 2018-12-12 DIAGNOSIS — I480 Paroxysmal atrial fibrillation: Secondary | ICD-10-CM

## 2018-12-12 DIAGNOSIS — Z800 Family history of malignant neoplasm of digestive organs: Secondary | ICD-10-CM | POA: Diagnosis not present

## 2018-12-12 DIAGNOSIS — Z806 Family history of leukemia: Secondary | ICD-10-CM | POA: Diagnosis not present

## 2018-12-12 DIAGNOSIS — J449 Chronic obstructive pulmonary disease, unspecified: Secondary | ICD-10-CM | POA: Diagnosis present

## 2018-12-12 DIAGNOSIS — K5730 Diverticulosis of large intestine without perforation or abscess without bleeding: Secondary | ICD-10-CM | POA: Diagnosis present

## 2018-12-12 DIAGNOSIS — S065X9A Traumatic subdural hemorrhage with loss of consciousness of unspecified duration, initial encounter: Secondary | ICD-10-CM | POA: Diagnosis not present

## 2018-12-12 DIAGNOSIS — R55 Syncope and collapse: Secondary | ICD-10-CM

## 2018-12-12 DIAGNOSIS — I700 Atherosclerosis of aorta: Secondary | ICD-10-CM | POA: Diagnosis present

## 2018-12-12 DIAGNOSIS — I361 Nonrheumatic tricuspid (valve) insufficiency: Secondary | ICD-10-CM

## 2018-12-12 DIAGNOSIS — E7800 Pure hypercholesterolemia, unspecified: Secondary | ICD-10-CM | POA: Diagnosis present

## 2018-12-12 DIAGNOSIS — Z7901 Long term (current) use of anticoagulants: Secondary | ICD-10-CM | POA: Diagnosis not present

## 2018-12-12 DIAGNOSIS — Z7982 Long term (current) use of aspirin: Secondary | ICD-10-CM | POA: Diagnosis not present

## 2018-12-12 DIAGNOSIS — I10 Essential (primary) hypertension: Secondary | ICD-10-CM | POA: Diagnosis not present

## 2018-12-12 DIAGNOSIS — E785 Hyperlipidemia, unspecified: Secondary | ICD-10-CM | POA: Diagnosis present

## 2018-12-12 DIAGNOSIS — Z8249 Family history of ischemic heart disease and other diseases of the circulatory system: Secondary | ICD-10-CM | POA: Diagnosis not present

## 2018-12-12 DIAGNOSIS — N400 Enlarged prostate without lower urinary tract symptoms: Secondary | ICD-10-CM | POA: Diagnosis present

## 2018-12-12 DIAGNOSIS — F419 Anxiety disorder, unspecified: Secondary | ICD-10-CM | POA: Diagnosis present

## 2018-12-12 DIAGNOSIS — I6200 Nontraumatic subdural hemorrhage, unspecified: Secondary | ICD-10-CM | POA: Diagnosis not present

## 2018-12-12 DIAGNOSIS — E041 Nontoxic single thyroid nodule: Secondary | ICD-10-CM | POA: Diagnosis not present

## 2018-12-12 DIAGNOSIS — E042 Nontoxic multinodular goiter: Secondary | ICD-10-CM | POA: Diagnosis present

## 2018-12-12 DIAGNOSIS — Z888 Allergy status to other drugs, medicaments and biological substances status: Secondary | ICD-10-CM | POA: Diagnosis not present

## 2018-12-12 DIAGNOSIS — Z87891 Personal history of nicotine dependence: Secondary | ICD-10-CM | POA: Diagnosis not present

## 2018-12-12 DIAGNOSIS — K760 Fatty (change of) liver, not elsewhere classified: Secondary | ICD-10-CM | POA: Diagnosis present

## 2018-12-12 LAB — CDS SEROLOGY

## 2018-12-12 LAB — ECHOCARDIOGRAM COMPLETE
Height: 71 in
Weight: 3139.35 oz

## 2018-12-12 LAB — TSH: TSH: 1.523 u[IU]/mL (ref 0.350–4.500)

## 2018-12-12 MED ORDER — BACITRACIN ZINC 500 UNIT/GM EX OINT
TOPICAL_OINTMENT | Freq: Two times a day (BID) | CUTANEOUS | Status: DC
  Administered 2018-12-12 – 2018-12-13 (×2): 31.5556 via TOPICAL
  Filled 2018-12-12: qty 28.4
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
## 2018-12-12 NOTE — Progress Notes (Signed)
PT Cancellation Note  Patient Details Name: Sean Lindsey MRN: [id number] DOB: [birth year]   Cancelled Treatment:    Reason Eval/Treat Not Completed: Patient at procedure or test/unavailable.  Will see pt later as able. 12/12/2018  Donnella Sham, [place] Acute Rehabilitation Services [phone]  (pager) [phone]  (office)   Tessie Fass Cinnamon Morency 12/12/2018, 3:05 PM

## 2018-12-12 NOTE — Progress Notes (Signed)
Bilateral carotid duplex completed. Preliminary results in Chart review CV Proc. [place] Bailea Beed,RVS 12/12/2018, 3:20 PM
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
## 2018-12-12 NOTE — Progress Notes (Signed)
Follow-up head CT scan with unchanged small left convexity subdural hematoma.  No indication for neurosurgical intervention.  Patient may be mobilized ad lib.  Patient should remain off anticoagulation for at least 2 weeks.  No need for follow-up imaging.

## 2018-12-12 NOTE — Progress Notes (Signed)
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 given the overall degree of atrophy. 2. Moderate atrophy and white matter disease is otherwise within normal limits for age. 3. Soft tissue swelling along the left side of the face without underlying facial fracture. 4. Multilevel degenerative changes within the cervical spine, left greater than right. Left foraminal narrowing is more prominent than right foraminal narrowing. 5. No acute fracture or traumatic subluxation in the cervical spine. Critical Value/emergent results were called by telephone at the time of interpretation on 12/11/2018 at 8:00 pm to Dr. Julianne Rice , who verbally acknowledged these results. Electronically Signed   By: San Morelle M.D.   On: 12/11/2018 20:04        Scheduled Meds: . amLODipine  10 mg Oral Daily  . atorvastatin  20 mg Oral Daily  . bacitracin   Topical BID  . finasteride  5 mg Oral Daily  . hydrochlorothiazide  25 mg Oral Daily  . irbesartan  300 mg Oral Daily  . metoprolol tartrate  50 mg Oral BID  . silodosin  8 mg Oral Q breakfast  . spironolactone  25 mg Oral Daily   Continuous Infusions:   LOS: 0 days        Aline August, MD [facility] 12/12/2018, 12:41 PM
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
## 2018-12-12 NOTE — Care Management Obs Status (Signed)
MEDICARE OBSERVATION STATUS NOTIFICATION   Patient Details  Name: Sean Lindsey MRN: [id number] Date of Birth: May 02, 1930   Medicare Observation Status Notification Given:  Yes    Sharin Mons, RN 12/12/2018, 12:19 PM

## 2018-12-12 NOTE — Care Management Obs Status (Signed)
MEDICARE OBSERVATION STATUS NOTIFICATION   Patient Details  Name: Sean Lindsey MRN: [id number] Date of Birth: October 10, 1930   Medicare Observation Status Notification Given:  Yes    Sharin Mons, RN 12/12/2018, 11:51 AM

## 2018-12-13 ENCOUNTER — Other Ambulatory Visit: Payer: Self-pay

## 2018-12-13 ENCOUNTER — Telehealth: Payer: Self-pay | Admitting: Cardiology

## 2018-12-13 ENCOUNTER — Ambulatory Visit: Payer: Medicare Other

## 2018-12-13 DIAGNOSIS — R55 Syncope and collapse: Secondary | ICD-10-CM

## 2018-12-13 DIAGNOSIS — I480 Paroxysmal atrial fibrillation: Secondary | ICD-10-CM

## 2018-12-13 LAB — CBC WITH DIFFERENTIAL/PLATELET
Abs Immature Granulocytes: 0.03 10*3/uL (ref 0.00–0.07)
Basophils Absolute: 0 10*3/uL (ref 0.0–0.1)
Basophils Relative: 0 %
EOS ABS: 0.3 10*3/uL (ref 0.0–0.5)
Eosinophils Relative: 3 %
HCT: 35.9 % — ABNORMAL LOW (ref 39.0–52.0)
Hemoglobin: 12.8 g/dL — ABNORMAL LOW (ref 13.0–17.0)
Immature Granulocytes: 0 %
Lymphocytes Relative: 23 %
Lymphs Abs: 2.3 10*3/uL (ref 0.7–4.0)
MCH: 31.5 pg (ref 26.0–34.0)
MCHC: 35.7 g/dL (ref 30.0–36.0)
MCV: 88.4 fL (ref 80.0–100.0)
MONO ABS: 1.2 10*3/uL — AB (ref 0.1–1.0)
Monocytes Relative: 12 %
Neutro Abs: 6.2 10*3/uL (ref 1.7–7.7)
Neutrophils Relative %: 62 %
Platelets: 265 10*3/uL (ref 150–400)
RBC: 4.06 MIL/uL — ABNORMAL LOW (ref 4.22–5.81)
RDW: 12.2 % (ref 11.5–15.5)
WBC: 10 10*3/uL (ref 4.0–10.5)
nRBC: 0 % (ref 0.0–0.2)

## 2018-12-13 LAB — BASIC METABOLIC PANEL
Anion gap: 7 (ref 5–15)
BUN: 21 mg/dL (ref 8–23)
CO2: 25 mmol/L (ref 22–32)
CREATININE: 0.96 mg/dL (ref 0.61–1.24)
Calcium: 8.8 mg/dL — ABNORMAL LOW (ref 8.9–10.3)
Chloride: 103 mmol/L (ref 98–111)
GFR calc Af Amer: >60 mL/min (ref >60)
GFR calc non Af Amer: >60 mL/min (ref >60)
Glucose, Bld: 100 mg/dL — ABNORMAL HIGH (ref 70–99)
Potassium: 3.7 mmol/L (ref 3.5–5.1)
Sodium: 135 mmol/L (ref 135–145)

## 2018-12-13 LAB — MAGNESIUM: Magnesium: 1.8 mg/dL (ref 1.7–2.4)

## 2018-12-13 MED ORDER — DILTIAZEM HCL 25 MG/5ML IV SOLN
5.0000 mg | Freq: Once | INTRAVENOUS | Status: AC
  Administered 2018-12-13: 5 mg via INTRAVENOUS
  Filled 2018-12-13: qty 5
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
## 2018-12-13 NOTE — Progress Notes (Signed)
Notified Kirby NP that pt's HR is 159 Afib. Pt states he is anxious and requesting a xanax. Baltazar Najjar, NP ordered EKG and Cardizem 5mg  IV. EKG showed AFib with RVR HR 128. Cardizem was given. Pt running Sinus tach HR 105 now. Notified Dr. Sloan Leiter of situation, stated ok. Will continue to monitor pt. Ranelle Oyster, RN

## 2018-12-13 NOTE — Progress Notes (Signed)
Nsg Discharge Note  Admit Date:  12/11/2018 Discharge date: 12/13/2018   DENARD TUMINELLO to be D/C'd Home per MD order.  AVS completed.  Copy for chart, and copy for patient signed, and dated. Patient/caregiver able to verbalize understanding.  Discharge Medication: Allergies as of 12/13/2018      Reactions   Flomax [tamsulosin Hcl] Other (See Comments)   Fainting   Uroxatral [alfuzosin] Other (See Comments)   Made patient feel bad.      Medication List    STOP taking these medications   ibuprofen 200 MG tablet Commonly known as:  ADVIL,MOTRIN   XARELTO 20 MG Tabs tablet Generic drug:  rivaroxaban     TAKE these medications   ALPRAZolam 0.25 MG tablet Commonly known as:  XANAX Take 0.25 mg by mouth daily as needed for anxiety.   amLODipine 10 MG tablet Commonly known as:  NORVASC Take 10 mg by mouth daily.   BENICAR HCT 40-25 MG tablet Generic drug:  olmesartan-hydrochlorothiazide Take 1 tablet by mouth daily.   finasteride 5 MG tablet Commonly known as:  PROSCAR Take 5 mg by mouth daily.   LIPITOR 20 MG tablet Generic drug:  atorvastatin Take 20 mg by mouth daily.   Melatonin 3 MG Tabs Take 3 mg by mouth at bedtime.   metoprolol tartrate 50 MG tablet Commonly known as:  LOPRESSOR Take 1 tablet by mouth 2 (two) times daily.   RAPAFLO 8 MG Caps capsule Generic drug:  silodosin Take 8 mg by mouth daily with breakfast.   spironolactone 25 MG tablet Commonly known as:  ALDACTONE Take 25 mg by mouth daily.       Discharge Assessment: Vitals:   12/13/18 0549 12/13/18 1133  BP: (!) 131/59 119/60  Pulse: 63 87  Resp: 18 16  Temp: 98.2 F (36.8 C) (!) 97.3 F (36.3 C)  SpO2: 97% 99%   Skin clean, dry and intact without evidence of skin break down, no evidence of skin tears noted. IV catheter discontinued intact. Site without signs and symptoms of complications - no redness or edema noted at insertion site, patient denies c/o pain - only slight tenderness  at site.  Dressing with slight pressure applied.  D/c Instructions-Education: Discharge instructions given to patient/family with verbalized understanding. D/c education completed with patient/family including follow up instructions, medication list, d/c activities limitations if indicated, with other d/c instructions as indicated by MD - patient able to verbalize understanding, all questions fully answered. Patient instructed to return to ED, call 911, or call MD for any changes in condition.  Patient escorted via [place], and D/C home via private auto.  Erasmo Leventhal, RN 12/13/2018 2:34 PM

## 2018-12-13 NOTE — Progress Notes (Signed)
SLP Cancellation Note  Patient Details Name: Sean Lindsey MRN: [id number] DOB: 1930-05-25   Cancelled treatment:   Pt is dressed and preparing to D/C at this time.  Speech/language evaluation not completed; however, OT evaluation probed cognition and executive function, with recs for no f/u.  SLP will sign off.  [name] L. Tivis Ringer, [place] Office number [phone] Pager [phone]         [name] 12/13/2018, 1:45 PM

## 2018-12-13 NOTE — Telephone Encounter (Signed)
Patient admitted with syncope, has small intracranial bleed hence off of Xarelto.  He will need event monitor for 30 days to exclude sick sinus syndrome.  Diagnosis is paroxysmal atrial fibrillation and syncope.  OV in 3-4 weeks

## 2018-12-13 NOTE — Progress Notes (Signed)
Wound care completed on bilateral hands.

## 2018-12-17 ENCOUNTER — Telehealth: Payer: Self-pay | Admitting: Cardiology

## 2018-12-17 NOTE — Telephone Encounter (Signed)
Event monitor alert received on 12/17/2018 around 6:45 PM Atrial flutter with variable conduction. Ventricular rate 100-120 bpm  Paroxysmal Afib noted on chart review. Has been off Xarelto due to recent intracranial bleed.  Copying patient's primary cardiologist Dr. Einar Gip for further follow up.  Nigel Mormon, MD [facility] Pager: [phone] Office: [phone] If no answer Cell [phone]

## 2018-12-18 NOTE — Telephone Encounter (Signed)
Event monitor for syncope. Has known PAF.Marland Kitchen

## 2018-12-20 ENCOUNTER — Telehealth: Payer: Self-pay

## 2018-12-20 NOTE — Telephone Encounter (Signed)
[employer] called to advise of Aflutter @ 163-165bpm. Fax to follow.//ah

## 2018-12-20 NOTE — Telephone Encounter (Signed)
I left you a message, call then that I do not need alerts for A. Fib or flutter but only for heart block

## 2018-12-25 NOTE — Telephone Encounter (Signed)
[employer] called to advise.//ah

## 2018-12-27 ENCOUNTER — Other Ambulatory Visit: Payer: Self-pay | Admitting: Cardiology

## 2019-01-11 DIAGNOSIS — R55 Syncope and collapse: Secondary | ICD-10-CM | POA: Diagnosis not present

## 2019-01-21 ENCOUNTER — Telehealth: Payer: Self-pay | Admitting: Cardiology

## 2019-01-22 ENCOUNTER — Telehealth: Payer: Self-pay

## 2019-01-23 ENCOUNTER — Ambulatory Visit (INDEPENDENT_AMBULATORY_CARE_PROVIDER_SITE_OTHER): Payer: Medicare Other | Admitting: Cardiology

## 2019-01-23 ENCOUNTER — Encounter: Payer: Self-pay | Admitting: Cardiology

## 2019-01-23 ENCOUNTER — Other Ambulatory Visit: Payer: Self-pay

## 2019-01-23 VITALS — BP 130/70 | HR 70 | Ht 71.0 in | Wt 190.0 lb

## 2019-01-23 DIAGNOSIS — R55 Syncope and collapse: Secondary | ICD-10-CM | POA: Diagnosis not present

## 2019-01-23 DIAGNOSIS — S065X9D Traumatic subdural hemorrhage with loss of consciousness of unspecified duration, subsequent encounter: Secondary | ICD-10-CM | POA: Diagnosis not present

## 2019-01-23 DIAGNOSIS — I452 Bifascicular block: Secondary | ICD-10-CM

## 2019-01-23 DIAGNOSIS — I480 Paroxysmal atrial fibrillation: Secondary | ICD-10-CM

## 2019-01-23 DIAGNOSIS — S065X9A Traumatic subdural hemorrhage with loss of consciousness of unspecified duration, initial encounter: Secondary | ICD-10-CM

## 2019-01-23 DIAGNOSIS — S065XAA Traumatic subdural hemorrhage with loss of consciousness status unknown, initial encounter: Secondary | ICD-10-CM

## 2019-01-23 DIAGNOSIS — I10 Essential (primary) hypertension: Secondary | ICD-10-CM

## 2019-01-23 MED ORDER — RIVAROXABAN 20 MG PO TABS: 20.0000 mg | ORAL_TABLET | Freq: Every day | ORAL | 6 refills | Status: DC
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
## 2019-03-03 NOTE — Telephone Encounter (Signed)
Patient being followed up for PAF.  He will resume Xarelto. No heart block on event monitor to explain syncope.

## 2019-03-07 ENCOUNTER — Other Ambulatory Visit: Payer: Self-pay

## 2019-03-07 ENCOUNTER — Ambulatory Visit (INDEPENDENT_AMBULATORY_CARE_PROVIDER_SITE_OTHER): Payer: Medicare Other | Admitting: Cardiology

## 2019-03-07 ENCOUNTER — Encounter: Payer: Self-pay | Admitting: Cardiology

## 2019-03-07 VITALS — BP 147/61 | HR 82 | Ht 71.0 in | Wt 184.0 lb

## 2019-03-07 DIAGNOSIS — S065X9A Traumatic subdural hemorrhage with loss of consciousness of unspecified duration, initial encounter: Secondary | ICD-10-CM

## 2019-03-07 DIAGNOSIS — I452 Bifascicular block: Secondary | ICD-10-CM

## 2019-03-07 DIAGNOSIS — R55 Syncope and collapse: Secondary | ICD-10-CM

## 2019-03-07 DIAGNOSIS — S065XAA Traumatic subdural hemorrhage with loss of consciousness status unknown, initial encounter: Secondary | ICD-10-CM

## 2019-03-07 DIAGNOSIS — I480 Paroxysmal atrial fibrillation: Secondary | ICD-10-CM | POA: Diagnosis not present
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
## 2019-03-26 DIAGNOSIS — I10 Essential (primary) hypertension: Secondary | ICD-10-CM | POA: Diagnosis not present

## 2019-03-26 DIAGNOSIS — E7800 Pure hypercholesterolemia, unspecified: Secondary | ICD-10-CM | POA: Diagnosis not present

## 2019-04-09 DIAGNOSIS — Z0000 Encounter for general adult medical examination without abnormal findings: Secondary | ICD-10-CM | POA: Diagnosis not present

## 2019-04-09 DIAGNOSIS — L409 Psoriasis, unspecified: Secondary | ICD-10-CM | POA: Diagnosis not present

## 2019-04-09 DIAGNOSIS — M5431 Sciatica, right side: Secondary | ICD-10-CM | POA: Diagnosis not present

## 2019-04-09 DIAGNOSIS — F419 Anxiety disorder, unspecified: Secondary | ICD-10-CM | POA: Diagnosis not present

## 2019-04-09 DIAGNOSIS — E7800 Pure hypercholesterolemia, unspecified: Secondary | ICD-10-CM | POA: Diagnosis not present

## 2019-04-09 DIAGNOSIS — I340 Nonrheumatic mitral (valve) insufficiency: Secondary | ICD-10-CM | POA: Diagnosis not present

## 2019-04-09 DIAGNOSIS — D649 Anemia, unspecified: Secondary | ICD-10-CM | POA: Diagnosis not present

## 2019-04-09 DIAGNOSIS — H6120 Impacted cerumen, unspecified ear: Secondary | ICD-10-CM | POA: Diagnosis not present

## 2019-04-09 DIAGNOSIS — R739 Hyperglycemia, unspecified: Secondary | ICD-10-CM | POA: Diagnosis not present

## 2019-04-09 DIAGNOSIS — I4891 Unspecified atrial fibrillation: Secondary | ICD-10-CM | POA: Diagnosis not present

## 2019-04-09 DIAGNOSIS — I10 Essential (primary) hypertension: Secondary | ICD-10-CM | POA: Diagnosis not present

## 2019-05-24 ENCOUNTER — Ambulatory Visit: Payer: Medicare Other | Admitting: Cardiology

## 2019-07-04 DIAGNOSIS — Z23 Encounter for immunization: Secondary | ICD-10-CM | POA: Diagnosis not present

## 2019-09-10 ENCOUNTER — Ambulatory Visit (INDEPENDENT_AMBULATORY_CARE_PROVIDER_SITE_OTHER): Payer: Medicare Other | Admitting: Cardiology

## 2019-09-10 ENCOUNTER — Other Ambulatory Visit: Payer: Self-pay

## 2019-09-10 ENCOUNTER — Encounter: Payer: Self-pay | Admitting: Cardiology

## 2019-09-10 VITALS — BP 122/65 | HR 90 | Ht 71.0 in | Wt 185.6 lb

## 2019-09-10 DIAGNOSIS — R55 Syncope and collapse: Secondary | ICD-10-CM | POA: Diagnosis not present

## 2019-09-10 DIAGNOSIS — I452 Bifascicular block: Secondary | ICD-10-CM

## 2019-09-10 DIAGNOSIS — I480 Paroxysmal atrial fibrillation: Secondary | ICD-10-CM | POA: Diagnosis not present
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
## 2019-10-02 DIAGNOSIS — E7800 Pure hypercholesterolemia, unspecified: Secondary | ICD-10-CM | POA: Diagnosis not present

## 2019-10-02 DIAGNOSIS — M5431 Sciatica, right side: Secondary | ICD-10-CM | POA: Diagnosis not present

## 2019-10-02 DIAGNOSIS — Z0000 Encounter for general adult medical examination without abnormal findings: Secondary | ICD-10-CM | POA: Diagnosis not present

## 2019-10-02 DIAGNOSIS — Z79899 Other long term (current) drug therapy: Secondary | ICD-10-CM | POA: Diagnosis not present

## 2019-10-02 DIAGNOSIS — D649 Anemia, unspecified: Secondary | ICD-10-CM | POA: Diagnosis not present

## 2019-11-06 ENCOUNTER — Other Ambulatory Visit: Payer: Self-pay

## 2019-11-06 MED ORDER — METOPROLOL TARTRATE 50 MG PO TABS: 50.0000 mg | ORAL_TABLET | Freq: Two times a day (BID) | ORAL | 3 refills | Status: DC

## 2019-11-12 ENCOUNTER — Ambulatory Visit: Payer: Medicare Other

## 2019-11-16 ENCOUNTER — Ambulatory Visit: Payer: Medicare Other | Attending: Internal Medicine

## 2019-11-16 DIAGNOSIS — Z23 Encounter for immunization: Secondary | ICD-10-CM

## 2019-11-16 NOTE — Progress Notes (Signed)
   Covid-19 Vaccination Clinic  Name:  Sean Lindsey    MRN: [id number] DOB: [birth year]  11/16/2019  Mr. Sean Lindsey was observed post Covid-19 immunization for 15 minutes without incidence. He was provided with Vaccine Information Sheet and instruction to access the V-Safe system.   Mr. Sean Lindsey was instructed to call 911 with any severe reactions post vaccine: Marland Kitchen Difficulty breathing  . Swelling of your face and throat  . A fast heartbeat  . A bad rash all over your body  . Dizziness and weakness    Immunizations Administered    Name Date Dose VIS Date Route   Pfizer COVID-19 Vaccine 11/16/2019  1:24 PM 0.3 mL 09/21/2019 Intramuscular   Manufacturer: [place]   Lot: [id number]   [place]: [id number]

## 2019-12-11 ENCOUNTER — Ambulatory Visit: Payer: Medicare Other | Attending: Internal Medicine

## 2019-12-11 DIAGNOSIS — Z23 Encounter for immunization: Secondary | ICD-10-CM | POA: Insufficient documentation

## 2019-12-11 NOTE — Progress Notes (Signed)
   Covid-19 Vaccination Clinic  Name:  Sean Lindsey    MRN: [id number] DOB: 23-Dec-1929  12/11/2019  [name] was observed post Covid-19 immunization for 15 minutes without incident. He was provided with Vaccine Information Sheet and instruction to access the V-Safe system.   [name] was instructed to call 911 with any severe reactions post vaccine: Marland Kitchen Difficulty breathing  . Swelling of face and throat  . A fast heartbeat  . A bad rash all over body  . Dizziness and weakness   Immunizations Administered    Name Date Dose VIS Date Route   Pfizer COVID-19 Vaccine 12/11/2019  1:48 PM 0.3 mL 09/21/2019 Intramuscular   Manufacturer: [place]   Lot: [id number]   [place]: [id number]

## 2019-12-25 DIAGNOSIS — L821 Other seborrheic keratosis: Secondary | ICD-10-CM | POA: Diagnosis not present

## 2019-12-25 DIAGNOSIS — L720 Epidermal cyst: Secondary | ICD-10-CM | POA: Diagnosis not present

## 2020-01-09 ENCOUNTER — Other Ambulatory Visit: Payer: Self-pay

## 2020-01-09 MED ORDER — RIVAROXABAN 20 MG PO TABS: 20.0000 mg | ORAL_TABLET | Freq: Every day | ORAL | 3 refills | Status: DC

## 2020-01-15 DIAGNOSIS — R972 Elevated prostate specific antigen [PSA]: Secondary | ICD-10-CM | POA: Diagnosis not present

## 2020-01-15 DIAGNOSIS — N400 Enlarged prostate without lower urinary tract symptoms: Secondary | ICD-10-CM | POA: Diagnosis not present

## 2020-02-20 ENCOUNTER — Other Ambulatory Visit: Payer: Self-pay

## 2020-02-20 MED ORDER — RIVAROXABAN 20 MG PO TABS: 20.0000 mg | ORAL_TABLET | Freq: Every day | ORAL | 3 refills | Status: DC
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
## 2020-03-12 ENCOUNTER — Encounter: Payer: Self-pay | Admitting: Cardiology

## 2020-03-12 ENCOUNTER — Ambulatory Visit: Payer: Medicare Other | Admitting: Cardiology

## 2020-03-12 ENCOUNTER — Other Ambulatory Visit: Payer: Self-pay

## 2020-03-12 VITALS — BP 140/70 | HR 76 | Resp 15 | Ht 71.0 in | Wt 184.0 lb

## 2020-03-12 DIAGNOSIS — I452 Bifascicular block: Secondary | ICD-10-CM | POA: Diagnosis not present

## 2020-03-12 DIAGNOSIS — I10 Essential (primary) hypertension: Secondary | ICD-10-CM | POA: Diagnosis not present

## 2020-03-12 DIAGNOSIS — I480 Paroxysmal atrial fibrillation: Secondary | ICD-10-CM | POA: Diagnosis not present

## 2020-03-12 DIAGNOSIS — E785 Hyperlipidemia, unspecified: Secondary | ICD-10-CM | POA: Diagnosis not present

## 2020-04-09 ENCOUNTER — Other Ambulatory Visit: Payer: Self-pay

## 2020-04-09 MED ORDER — RIVAROXABAN 20 MG PO TABS: 20.0000 mg | ORAL_TABLET | Freq: Every day | ORAL | 3 refills | Status: DC

## 2020-04-16 DIAGNOSIS — Z79899 Other long term (current) drug therapy: Secondary | ICD-10-CM | POA: Diagnosis not present

## 2020-04-16 DIAGNOSIS — D649 Anemia, unspecified: Secondary | ICD-10-CM | POA: Diagnosis not present

## 2020-04-23 DIAGNOSIS — F419 Anxiety disorder, unspecified: Secondary | ICD-10-CM | POA: Diagnosis not present

## 2020-04-23 DIAGNOSIS — L409 Psoriasis, unspecified: Secondary | ICD-10-CM | POA: Diagnosis not present

## 2020-04-23 DIAGNOSIS — E7800 Pure hypercholesterolemia, unspecified: Secondary | ICD-10-CM | POA: Diagnosis not present

## 2020-04-23 DIAGNOSIS — I4891 Unspecified atrial fibrillation: Secondary | ICD-10-CM | POA: Diagnosis not present

## 2020-04-23 DIAGNOSIS — Z0000 Encounter for general adult medical examination without abnormal findings: Secondary | ICD-10-CM | POA: Diagnosis not present

## 2020-04-23 DIAGNOSIS — I340 Nonrheumatic mitral (valve) insufficiency: Secondary | ICD-10-CM | POA: Diagnosis not present

## 2020-04-23 DIAGNOSIS — M5431 Sciatica, right side: Secondary | ICD-10-CM | POA: Diagnosis not present

## 2020-04-23 DIAGNOSIS — R7303 Prediabetes: Secondary | ICD-10-CM | POA: Diagnosis not present

## 2020-04-23 DIAGNOSIS — I10 Essential (primary) hypertension: Secondary | ICD-10-CM | POA: Diagnosis not present

## 2020-05-05 DIAGNOSIS — H6123 Impacted cerumen, bilateral: Secondary | ICD-10-CM | POA: Diagnosis not present

## 2020-05-08 ENCOUNTER — Other Ambulatory Visit: Payer: Self-pay | Admitting: Cardiology

## 2020-07-14 ENCOUNTER — Encounter: Payer: Self-pay | Admitting: Gastroenterology

## 2020-07-22 DIAGNOSIS — Z23 Encounter for immunization: Secondary | ICD-10-CM | POA: Diagnosis not present

## 2020-08-04 DIAGNOSIS — Z23 Encounter for immunization: Secondary | ICD-10-CM | POA: Diagnosis not present

## 2020-09-11 ENCOUNTER — Ambulatory Visit: Payer: Medicare Other | Admitting: Cardiology

## 2020-09-11 ENCOUNTER — Encounter: Payer: Self-pay | Admitting: Cardiology

## 2020-09-11 ENCOUNTER — Other Ambulatory Visit: Payer: Self-pay

## 2020-09-11 VITALS — BP 119/55 | HR 93 | Resp 15 | Ht 71.0 in | Wt 186.0 lb

## 2020-09-11 DIAGNOSIS — R0989 Other specified symptoms and signs involving the circulatory and respiratory systems: Secondary | ICD-10-CM

## 2020-09-11 DIAGNOSIS — I10 Essential (primary) hypertension: Secondary | ICD-10-CM

## 2020-09-11 DIAGNOSIS — I480 Paroxysmal atrial fibrillation: Secondary | ICD-10-CM | POA: Diagnosis not present

## 2020-09-11 DIAGNOSIS — I452 Bifascicular block: Secondary | ICD-10-CM | POA: Diagnosis not present
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
## 2020-09-12 NOTE — Addendum Note (Signed)
Addended by: Kela Millin on: 09/12/2020 12:00 PM   Modules accepted: Orders

## 2020-09-16 DIAGNOSIS — I10 Essential (primary) hypertension: Secondary | ICD-10-CM | POA: Diagnosis not present

## 2020-09-16 DIAGNOSIS — I480 Paroxysmal atrial fibrillation: Secondary | ICD-10-CM | POA: Diagnosis not present

## 2020-09-16 DIAGNOSIS — R0989 Other specified symptoms and signs involving the circulatory and respiratory systems: Secondary | ICD-10-CM | POA: Diagnosis not present

## 2020-09-17 LAB — CMP14+EGFR
ALT: 12 IU/L (ref 0–44)
AST: 19 IU/L (ref 0–40)
Albumin/Globulin Ratio: 2 (ref 1.2–2.2)
Albumin: 4.2 g/dL (ref 3.6–4.6)
Alkaline Phosphatase: 70 IU/L (ref 44–121)
BUN/Creatinine Ratio: 20 (ref 10–24)
BUN: 24 mg/dL (ref 8–27)
Bilirubin Total: 0.7 mg/dL (ref 0.0–1.2)
CO2: 22 mmol/L (ref 20–29)
Calcium: 9.4 mg/dL (ref 8.6–10.2)
Chloride: 102 mmol/L (ref 96–106)
Creatinine, Ser: 1.2 mg/dL (ref 0.76–1.27)
GFR calc Af Amer: 62 mL/min/{1.73_m2} (ref >59)
GFR calc non Af Amer: 54 mL/min/{1.73_m2} — ABNORMAL LOW (ref >59)
Globulin, Total: 2.1 g/dL (ref 1.5–4.5)
Glucose: 106 mg/dL — ABNORMAL HIGH (ref 65–99)
Potassium: 4.2 mmol/L (ref 3.5–5.2)
Sodium: 141 mmol/L (ref 134–144)
Total Protein: 6.3 g/dL (ref 6.0–8.5)

## 2020-09-17 LAB — CBC
Hematocrit: 39.9 % (ref 37.5–51.0)
Hemoglobin: 14.2 g/dL (ref 13.0–17.7)
MCH: 32 pg (ref 26.6–33.0)
MCHC: 35.6 g/dL (ref 31.5–35.7)
MCV: 90 fL (ref 79–97)
Platelets: 241 10*3/uL (ref 150–450)
RBC: 4.44 x10E6/uL (ref 4.14–5.80)
RDW: 12.6 % (ref 11.6–15.4)
WBC: 6.5 10*3/uL (ref 3.4–10.8)

## 2020-09-17 LAB — LIPID PANEL WITH LDL/HDL RATIO
Cholesterol, Total: 140 mg/dL (ref 100–199)
HDL: 65 mg/dL (ref >39)
LDL Chol Calc (NIH): 61 mg/dL (ref 0–99)
LDL/HDL Ratio: 0.9 ratio (ref 0.0–3.6)
Triglycerides: 71 mg/dL (ref 0–149)
VLDL Cholesterol Cal: 14 mg/dL (ref 5–40)

## 2020-09-17 NOTE — Progress Notes (Signed)
Normal blood count and stable kidney function

## 2020-09-18 NOTE — Progress Notes (Signed)
Called and spoke with patient regarding CBC. Patient voiced understanding. AD/S

## 2020-12-01 DIAGNOSIS — R0781 Pleurodynia: Secondary | ICD-10-CM | POA: Diagnosis not present

## 2020-12-01 DIAGNOSIS — F419 Anxiety disorder, unspecified: Secondary | ICD-10-CM | POA: Diagnosis not present

## 2020-12-01 DIAGNOSIS — M545 Low back pain, unspecified: Secondary | ICD-10-CM | POA: Diagnosis not present

## 2021-01-19 DIAGNOSIS — R3914 Feeling of incomplete bladder emptying: Secondary | ICD-10-CM | POA: Diagnosis not present

## 2021-01-19 DIAGNOSIS — R972 Elevated prostate specific antigen [PSA]: Secondary | ICD-10-CM | POA: Diagnosis not present

## 2021-01-19 DIAGNOSIS — N401 Enlarged prostate with lower urinary tract symptoms: Secondary | ICD-10-CM | POA: Diagnosis not present

## 2021-02-25 DIAGNOSIS — L720 Epidermal cyst: Secondary | ICD-10-CM | POA: Diagnosis not present

## 2021-03-11 DIAGNOSIS — Z23 Encounter for immunization: Secondary | ICD-10-CM | POA: Diagnosis not present

## 2021-03-16 ENCOUNTER — Ambulatory Visit: Payer: Medicare Other | Admitting: Cardiology

## 2021-04-01 ENCOUNTER — Encounter: Payer: Self-pay | Admitting: Cardiology

## 2021-04-01 ENCOUNTER — Ambulatory Visit: Payer: Medicare Other | Admitting: Cardiology

## 2021-04-01 ENCOUNTER — Other Ambulatory Visit: Payer: Self-pay

## 2021-04-01 VITALS — BP 129/75 | HR 108 | Temp 98.3°F | Resp 17 | Ht 71.0 in | Wt 188.4 lb

## 2021-04-01 DIAGNOSIS — I10 Essential (primary) hypertension: Secondary | ICD-10-CM | POA: Diagnosis not present

## 2021-04-01 DIAGNOSIS — I4811 Longstanding persistent atrial fibrillation: Secondary | ICD-10-CM | POA: Diagnosis not present

## 2021-04-01 DIAGNOSIS — I452 Bifascicular block: Secondary | ICD-10-CM | POA: Diagnosis not present
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
## 2021-04-27 ENCOUNTER — Telehealth: Payer: Self-pay

## 2021-04-27 NOTE — Telephone Encounter (Signed)
What is the new medication? Also can he come around 9:30 for a quick look. He should be prepared that I may be delayed as I am in the hospital

## 2021-04-27 NOTE — Telephone Encounter (Signed)
Pt called and stated that he has a rash on his arms and legs. A little bit of swelling in his legs, but that has been there for a while. Pt has not started any new medications recently. Rash itches and he would like to know if you can give him anything that will make it go away. Pt stated his PCP is sick and he does not want his PA to look at it.

## 2021-04-28 ENCOUNTER — Other Ambulatory Visit: Payer: Self-pay

## 2021-04-28 ENCOUNTER — Encounter: Payer: Self-pay | Admitting: Cardiology

## 2021-04-28 ENCOUNTER — Ambulatory Visit: Payer: Medicare Other | Admitting: Cardiology

## 2021-04-28 VITALS — BP 133/72 | HR 123 | Temp 98.3°F | Resp 17 | Ht 71.0 in | Wt 187.4 lb

## 2021-04-28 DIAGNOSIS — R21 Rash and other nonspecific skin eruption: Secondary | ICD-10-CM | POA: Diagnosis not present

## 2021-04-28 DIAGNOSIS — I480 Paroxysmal atrial fibrillation: Secondary | ICD-10-CM | POA: Diagnosis not present

## 2021-04-28 MED ORDER — HYDROXYZINE PAMOATE 25 MG PO CAPS: 25.0000 mg | ORAL_CAPSULE | Freq: Four times a day (QID) | ORAL | 0 refills | Status: DC | PRN

## 2021-04-28 MED ORDER — HYDROXYZINE PAMOATE 25 MG PO CAPS: 100.0000 mg | ORAL_CAPSULE | Freq: Four times a day (QID) | ORAL | 0 refills | Status: DC | PRN
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
## 2021-04-28 NOTE — Addendum Note (Signed)
Addended by: Kela Millin on: 04/28/2021 02:29 PM   Modules accepted: Orders

## 2021-04-28 NOTE — Telephone Encounter (Signed)
Pt coming in for an office visit today with Dr.Ganji around 9:45-10:00 for a rash. Please schedule pt.

## 2021-05-15 ENCOUNTER — Other Ambulatory Visit: Payer: Self-pay | Admitting: Cardiology

## 2021-05-21 DIAGNOSIS — I83893 Varicose veins of bilateral lower extremities with other complications: Secondary | ICD-10-CM | POA: Diagnosis not present

## 2021-06-22 ENCOUNTER — Other Ambulatory Visit: Payer: Self-pay

## 2021-06-22 MED ORDER — RIVAROXABAN 20 MG PO TABS: 20.0000 mg | ORAL_TABLET | Freq: Every day | ORAL | 3 refills | Status: DC

## 2021-06-23 ENCOUNTER — Other Ambulatory Visit: Payer: Self-pay

## 2021-06-23 MED ORDER — RIVAROXABAN 20 MG PO TABS: 20.0000 mg | ORAL_TABLET | Freq: Every day | ORAL | 3 refills | Status: DC

## 2021-07-14 ENCOUNTER — Ambulatory Visit: Payer: Medicare Other

## 2021-07-14 ENCOUNTER — Other Ambulatory Visit: Payer: Self-pay

## 2021-07-14 DIAGNOSIS — I4811 Longstanding persistent atrial fibrillation: Secondary | ICD-10-CM

## 2021-07-16 DIAGNOSIS — R7303 Prediabetes: Secondary | ICD-10-CM | POA: Diagnosis not present

## 2021-07-16 DIAGNOSIS — E7800 Pure hypercholesterolemia, unspecified: Secondary | ICD-10-CM | POA: Diagnosis not present

## 2021-07-16 DIAGNOSIS — Z125 Encounter for screening for malignant neoplasm of prostate: Secondary | ICD-10-CM | POA: Diagnosis not present

## 2021-07-21 NOTE — Progress Notes (Signed)
Called patient, left results on VM.

## 2021-07-22 NOTE — Progress Notes (Signed)
Called and spoke with patient, he stated that he received the message I left him on the VM about his echocardiogram results.

## 2021-07-23 DIAGNOSIS — I4891 Unspecified atrial fibrillation: Secondary | ICD-10-CM | POA: Diagnosis not present

## 2021-07-23 DIAGNOSIS — R7303 Prediabetes: Secondary | ICD-10-CM | POA: Diagnosis not present

## 2021-07-23 DIAGNOSIS — Z0000 Encounter for general adult medical examination without abnormal findings: Secondary | ICD-10-CM | POA: Diagnosis not present

## 2021-07-23 DIAGNOSIS — I10 Essential (primary) hypertension: Secondary | ICD-10-CM | POA: Diagnosis not present

## 2021-07-23 DIAGNOSIS — E7800 Pure hypercholesterolemia, unspecified: Secondary | ICD-10-CM | POA: Diagnosis not present

## 2021-07-24 DIAGNOSIS — Z23 Encounter for immunization: Secondary | ICD-10-CM | POA: Diagnosis not present

## 2021-08-14 DIAGNOSIS — Z23 Encounter for immunization: Secondary | ICD-10-CM | POA: Diagnosis not present

## 2021-09-22 ENCOUNTER — Encounter: Payer: Self-pay | Admitting: Cardiology

## 2021-09-22 ENCOUNTER — Other Ambulatory Visit: Payer: Self-pay

## 2021-09-22 ENCOUNTER — Ambulatory Visit: Payer: Medicare Other | Admitting: Cardiology

## 2021-09-22 VITALS — BP 137/77 | HR 105 | Temp 97.5°F | Resp 17 | Ht 71.0 in | Wt 192.4 lb

## 2021-09-22 DIAGNOSIS — I480 Paroxysmal atrial fibrillation: Secondary | ICD-10-CM

## 2021-09-22 DIAGNOSIS — I10 Essential (primary) hypertension: Secondary | ICD-10-CM | POA: Diagnosis not present

## 2021-09-22 DIAGNOSIS — N1831 Chronic kidney disease, stage 3a: Secondary | ICD-10-CM | POA: Diagnosis not present
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
## 2021-10-01 ENCOUNTER — Ambulatory Visit: Payer: Medicare Other | Admitting: Cardiology

## 2021-10-28 DIAGNOSIS — H353133 Nonexudative age-related macular degeneration, bilateral, advanced atrophic without subfoveal involvement: Secondary | ICD-10-CM | POA: Diagnosis not present

## 2022-01-21 DIAGNOSIS — R7303 Prediabetes: Secondary | ICD-10-CM | POA: Diagnosis not present

## 2022-01-21 DIAGNOSIS — E7800 Pure hypercholesterolemia, unspecified: Secondary | ICD-10-CM | POA: Diagnosis not present

## 2022-01-21 DIAGNOSIS — I4891 Unspecified atrial fibrillation: Secondary | ICD-10-CM | POA: Diagnosis not present

## 2022-01-25 DIAGNOSIS — N401 Enlarged prostate with lower urinary tract symptoms: Secondary | ICD-10-CM | POA: Diagnosis not present

## 2022-01-25 DIAGNOSIS — R3912 Poor urinary stream: Secondary | ICD-10-CM | POA: Diagnosis not present

## 2022-03-23 ENCOUNTER — Ambulatory Visit: Payer: Medicare Other | Admitting: Cardiology

## 2022-04-22 ENCOUNTER — Ambulatory Visit: Payer: Medicare Other | Admitting: Cardiology

## 2022-04-22 ENCOUNTER — Encounter: Payer: Self-pay | Admitting: Cardiology

## 2022-04-22 VITALS — BP 117/66 | HR 91 | Temp 97.9°F | Resp 16 | Ht 71.0 in | Wt 188.0 lb

## 2022-04-22 DIAGNOSIS — H9192 Unspecified hearing loss, left ear: Secondary | ICD-10-CM | POA: Diagnosis not present

## 2022-04-22 DIAGNOSIS — I10 Essential (primary) hypertension: Secondary | ICD-10-CM | POA: Diagnosis not present

## 2022-04-22 DIAGNOSIS — I480 Paroxysmal atrial fibrillation: Secondary | ICD-10-CM | POA: Diagnosis not present
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
## 2022-04-28 DIAGNOSIS — H353133 Nonexudative age-related macular degeneration, bilateral, advanced atrophic without subfoveal involvement: Secondary | ICD-10-CM | POA: Diagnosis not present

## 2022-04-28 DIAGNOSIS — H26491 Other secondary cataract, right eye: Secondary | ICD-10-CM | POA: Diagnosis not present

## 2022-07-02 ENCOUNTER — Other Ambulatory Visit: Payer: Self-pay | Admitting: Cardiology

## 2022-07-11 ENCOUNTER — Other Ambulatory Visit: Payer: Self-pay | Admitting: Cardiology

## 2022-07-19 DIAGNOSIS — Z0000 Encounter for general adult medical examination without abnormal findings: Secondary | ICD-10-CM | POA: Diagnosis not present

## 2022-07-19 DIAGNOSIS — E7800 Pure hypercholesterolemia, unspecified: Secondary | ICD-10-CM | POA: Diagnosis not present

## 2022-07-19 DIAGNOSIS — Z125 Encounter for screening for malignant neoplasm of prostate: Secondary | ICD-10-CM | POA: Diagnosis not present

## 2022-07-19 DIAGNOSIS — R739 Hyperglycemia, unspecified: Secondary | ICD-10-CM | POA: Diagnosis not present

## 2022-07-26 DIAGNOSIS — E7800 Pure hypercholesterolemia, unspecified: Secondary | ICD-10-CM | POA: Diagnosis not present

## 2022-07-26 DIAGNOSIS — Z0000 Encounter for general adult medical examination without abnormal findings: Secondary | ICD-10-CM | POA: Diagnosis not present

## 2022-07-26 DIAGNOSIS — Z23 Encounter for immunization: Secondary | ICD-10-CM | POA: Diagnosis not present

## 2022-07-26 DIAGNOSIS — I10 Essential (primary) hypertension: Secondary | ICD-10-CM | POA: Diagnosis not present

## 2022-07-26 DIAGNOSIS — I4891 Unspecified atrial fibrillation: Secondary | ICD-10-CM | POA: Diagnosis not present

## 2022-07-26 DIAGNOSIS — R7303 Prediabetes: Secondary | ICD-10-CM | POA: Diagnosis not present

## 2022-08-03 DIAGNOSIS — H9122 Sudden idiopathic hearing loss, left ear: Secondary | ICD-10-CM | POA: Diagnosis not present

## 2022-08-03 DIAGNOSIS — I4891 Unspecified atrial fibrillation: Secondary | ICD-10-CM | POA: Diagnosis not present

## 2022-08-03 DIAGNOSIS — M79602 Pain in left arm: Secondary | ICD-10-CM | POA: Diagnosis not present

## 2022-08-03 DIAGNOSIS — M7712 Lateral epicondylitis, left elbow: Secondary | ICD-10-CM | POA: Diagnosis not present

## 2022-08-24 ENCOUNTER — Encounter: Payer: Self-pay | Admitting: Cardiology

## 2022-08-24 ENCOUNTER — Ambulatory Visit: Payer: Medicare Other

## 2022-08-24 VITALS — BP 146/69 | HR 82 | Resp 15 | Ht 71.0 in | Wt 195.2 lb

## 2022-08-24 DIAGNOSIS — I10 Essential (primary) hypertension: Secondary | ICD-10-CM

## 2022-08-24 DIAGNOSIS — R600 Localized edema: Secondary | ICD-10-CM | POA: Diagnosis not present

## 2022-08-24 DIAGNOSIS — I4811 Longstanding persistent atrial fibrillation: Secondary | ICD-10-CM

## 2022-08-24 DIAGNOSIS — D170 Benign lipomatous neoplasm of skin and subcutaneous tissue of head, face and neck: Secondary | ICD-10-CM | POA: Diagnosis not present

## 2022-08-24 MED ORDER — POTASSIUM CHLORIDE ER 10 MEQ PO TBCR: 10.0000 meq | EXTENDED_RELEASE_TABLET | Freq: Every day | ORAL | 0 refills | Status: DC

## 2022-08-24 MED ORDER — AMIODARONE HCL 200 MG PO TABS: 200.0000 mg | ORAL_TABLET | Freq: Two times a day (BID) | ORAL | 3 refills | Status: DC

## 2022-08-24 MED ORDER — FUROSEMIDE 40 MG PO TABS: 40.0000 mg | ORAL_TABLET | Freq: Two times a day (BID) | ORAL | 3 refills | Status: DC
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
## 2022-08-25 ENCOUNTER — Ambulatory Visit: Payer: Medicare Other

## 2022-08-25 DIAGNOSIS — I10 Essential (primary) hypertension: Secondary | ICD-10-CM

## 2022-08-25 DIAGNOSIS — R600 Localized edema: Secondary | ICD-10-CM

## 2022-08-25 DIAGNOSIS — I4811 Longstanding persistent atrial fibrillation: Secondary | ICD-10-CM

## 2022-08-25 LAB — BASIC METABOLIC PANEL
BUN/Creatinine Ratio: 20 (ref 10–24)
BUN: 20 mg/dL (ref 10–36)
CO2: 25 mmol/L (ref 20–29)
Calcium: 9.1 mg/dL (ref 8.6–10.2)
Chloride: 100 mmol/L (ref 96–106)
Creatinine, Ser: 0.99 mg/dL (ref 0.76–1.27)
Glucose: 115 mg/dL — ABNORMAL HIGH (ref 70–99)
Potassium: 4.1 mmol/L (ref 3.5–5.2)
Sodium: 138 mmol/L (ref 134–144)
eGFR: 72 mL/min/{1.73_m2} (ref >59)

## 2022-08-25 LAB — PRO B NATRIURETIC PEPTIDE: NT-Pro BNP: 1573 pg/mL — ABNORMAL HIGH (ref 0–486)

## 2022-08-31 ENCOUNTER — Ambulatory Visit: Payer: Medicare Other

## 2022-08-31 ENCOUNTER — Other Ambulatory Visit: Payer: Self-pay

## 2022-08-31 DIAGNOSIS — R600 Localized edema: Secondary | ICD-10-CM | POA: Diagnosis not present

## 2022-08-31 NOTE — Progress Notes (Signed)
Called patient no answer, left a vm

## 2022-09-01 ENCOUNTER — Ambulatory Visit: Payer: Medicare Other

## 2022-09-01 VITALS — BP 134/73 | HR 82 | Ht 71.0 in | Wt 184.2 lb

## 2022-09-01 DIAGNOSIS — I4811 Longstanding persistent atrial fibrillation: Secondary | ICD-10-CM

## 2022-09-01 DIAGNOSIS — R600 Localized edema: Secondary | ICD-10-CM

## 2022-09-01 DIAGNOSIS — I10 Essential (primary) hypertension: Secondary | ICD-10-CM | POA: Diagnosis not present

## 2022-09-01 LAB — BASIC METABOLIC PANEL WITH GFR
BUN/Creatinine Ratio: 20 (ref 10–24)
BUN: 23 mg/dL (ref 10–36)
CO2: 27 mmol/L (ref 20–29)
Calcium: 9 mg/dL (ref 8.6–10.2)
Chloride: 100 mmol/L (ref 96–106)
Creatinine, Ser: 1.16 mg/dL (ref 0.76–1.27)
Glucose: 134 mg/dL — ABNORMAL HIGH (ref 70–99)
Potassium: 3.8 mmol/L (ref 3.5–5.2)
Sodium: 140 mmol/L (ref 134–144)
eGFR: 60 mL/min/1.73

## 2022-09-01 LAB — PRO B NATRIURETIC PEPTIDE: NT-Pro BNP: 1777 pg/mL — ABNORMAL HIGH (ref 0–486)
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
## 2022-09-28 NOTE — Progress Notes (Signed)
Called patient no answer left a vm

## 2022-10-27 ENCOUNTER — Ambulatory Visit: Payer: Medicare Other | Admitting: Cardiology

## 2022-10-27 NOTE — Progress Notes (Deleted)
Subjective:  Primary Physician/Referring:  Sean Commons, FNP  Patient ID: Sean Lindsey, male    DOB: August 27, 1930, 89 y.o.   MRN: [id number]  No chief complaint on file.   HPI: Sean Lindsey  is a 89 y.o. Caucasian male patient with permanent atrial fibrillation on chronic anticoagulation with Xarelto, chronic diastolic heart failure hyperglycemia, hyperlipidemia,  and hypertension with stage IIIa chronic kidney disease.   In July 2023, due to bilateral leg edema he was started on diuretics with Lasix, with resolution of leg edema.  Due to A-fib with RVR, was started on amiodarone, patient did not want to be on this due to side effect profile.  Overall did well and he presents here for 43-monthoffice visit.  Past Medical History:  Diagnosis Date   Anxiety    Arrhythmia    BPH (benign prostatic hyperplasia)    Hypercholesterolemia    Hyperglycemia    Hyperlipidemia    Hyperplastic colon polyp    Hypertension    Benign   Internal hemorrhoids    Lipoma    Near cecum   Lung nodules 11/28/2010   History of   Other abnormal glucose    PSA elevation     Past Surgical History:  Procedure Laterality Date   Left thyroid nodule     FNA   Social History   Tobacco Use   Smoking status: Former    Packs/day: 1.00    Years: 20.00    Total pack years: 20.00    Types: Cigarettes    Quit date: 10/11/1984    Years since quitting: 38.0   Smokeless tobacco: Never  Substance Use Topics   Alcohol use: Yes    Alcohol/week: 1.0 standard drink of alcohol    Types: 1 Glasses of wine per week    Comment: NIGHTLY   Marital Status: Married   Current Outpatient Medications:    ALPRAZolam (XANAX) 0.25 MG tablet, Take 0.25 mg by mouth daily as needed for anxiety. , Disp: , Rfl:    atorvastatin (LIPITOR) 20 MG tablet, Take 20 mg by mouth daily., Disp: , Rfl:    finasteride (PROSCAR) 5 MG tablet, Take 5 mg by mouth daily., Disp: , Rfl:    furosemide (LASIX) 40 MG tablet, Take 1  tablet (40 mg total) by mouth 2 (two) times daily., Disp: 60 tablet, Rfl: 3   melatonin 3 MG TABS tablet, Take 1 tablet by mouth at bedtime., Disp: , Rfl:    metoprolol tartrate (LOPRESSOR) 50 MG tablet, Take 1 tablet (50 mg total) by mouth 2 (two) times daily., Disp: 180 tablet, Rfl: 3   olmesartan-hydrochlorothiazide (BENICAR HCT) 40-25 MG tablet, Take 1 tablet by mouth daily. , Disp: , Rfl:    potassium chloride (KLOR-CON) 10 MEQ tablet, Take 1 tablet (10 mEq total) by mouth daily., Disp: 30 tablet, Rfl: 0   silodosin (RAPAFLO) 8 MG CAPS capsule, Take 8 mg by mouth daily with breakfast. , Disp: , Rfl:    spironolactone (ALDACTONE) 25 MG tablet, TAKE ONE TABLET EVERY MORNING, Disp: 90 tablet, Rfl: 3   XARELTO 20 MG TABS tablet, TAKE 1 TABLET DAILY WITH SUPPER, Disp: 90 tablet, Rfl: 3    Review of Systems  Cardiovascular:  Positive for leg swelling (improving). Negative for chest pain, dyspnea on exertion, near-syncope, orthopnea, palpitations and syncope.  Gastrointestinal:  Negative for melena.  Neurological:  Negative for dizziness.        09/01/2022   11:33 AM 08/24/2022  2:58 PM 04/22/2022    3:12 PM  Vitals with BMI  Height 5' 11"$  5' 11"$  5' 11"$   Weight 184 lbs 3 oz 195 lbs 3 oz 188 lbs  BMI 25.7 [id number]  Systolic [id number]  Diastolic 73 69 66  Pulse 82 82 91    Physical Exam Neck:     Vascular: No carotid bruit or JVD.  Cardiovascular:     Rate and Rhythm: Normal rate. Rhythm irregular.     Pulses:          Carotid pulses are 2+ on the right side and 2+ on the left side.      Radial pulses are 2+ on the right side and 2+ on the left side.       Dorsalis pedis pulses are 0 on the right side and 0 on the left side.       Posterior tibial pulses are 0 on the right side and 0 on the left side.     Heart sounds: Normal heart sounds. No murmur heard.    No gallop.  Pulmonary:     Effort: Pulmonary effort is normal.     Breath sounds: Normal breath sounds.   Abdominal:     General: Bowel sounds are normal.     Palpations: Abdomen is soft.  Musculoskeletal:     Right lower leg: 2+ Pitting Edema present.     Left lower leg: 2+ Pitting Edema present.  Skin:    General: Skin is warm.     Capillary Refill: Capillary refill takes less than 2 seconds.     Findings: Erythema (BLE) present.      Lab Results  Component Value Date   NA 140 08/31/2022   K 3.8 08/31/2022   CO2 27 08/31/2022   GLUCOSE 134 (H) 08/31/2022   BUN 23 08/31/2022   CREATININE 1.16 08/31/2022   CALCIUM 9.0 08/31/2022   EGFR 60 08/31/2022   GFRNONAA 54 (L) 09/16/2020    ProBNP (last 3 results) Recent Labs    08/24/22 1612 08/31/22 1153  PROBNP 1,573* 1,777*    External labs:  Labs 07/20/2022:  A1c 5.9%.  Total cholesterol 137, triglycerides 68, HDL 60, LDL 63.  Hb 14.0/HCT 41.5, platelets 263, normal indicis.  Sodium 139, potassium 3.7, BUN 21, creatinine 0.95, EGFR 70 mL.  Cardiac studies:   Carotid artery duplex Dec 17, 2018:  No significant carotid disease.  Antegrade vertebral artery flow.  Event Monitor for 30 days Start date 12/15/2018:  Paroxysmal episodes of atrial fibrillation with RVR.  There was a 3.0 Sec ventricular pause at 04:40 9 PM.  Minimum heart rate 49 bpm, sinus.  Maximum heart rate 156 bpm, atrial fibrillation with RVR.  No symptoms reported.  Echocardiogram 08/25/2022: Left ventricle cavity is normal in size. Mild concentric hypertrophy of the left ventricle. Normal global wall motion. Normal LV systolic function with EF 60%. Indeterminate diastolic filling pattern due to atrial fibrillation. Mild to moderate tricuspid regurgitation. Estimated pulmonary artery systolic pressure 37 mmHg. No significant change compared to previous study on 07/14/2021.   EKG:   EKG 08/24/2022: Atrial fibrillation at the rate of 101 bpm, left axis deviation, left anterior fascicular block.  Right bundle branch block.  Bifascicular block.  No  significant change from previous EKG.  Assessment:      ICD-10-CM   1. Longstanding persistent atrial fibrillation (HCC)  I48.11     2. Primary hypertension  I10     3. Stage 3a chronic kidney  disease ([place])  N18.31        CHA2DS2-VASc Score is 3.  Yearly risk of stroke: 3.8% (A, HTN).  Score of 1=0.6; 2=2.2; 3=3.2; 4=4.8; 5=7.2; 6=9.8; 7=>9.8) -(CHF; HTN; vasc disease DM,  Male = 1; Age <65 =0; 65-74 = 1,  >75 =2; stroke/embolism= 2).    No orders of the defined types were placed in this encounter.   Recommendations:    Sean Lindsey  is a 89 y.o. Caucasian male patient with permanent atrial fibrillation on chronic anticoagulation with Xarelto, chronic diastolic heart failure hyperglycemia, hyperlipidemia,  and hypertension with stage IIIa chronic kidney disease.   In July 2023, due to bilateral leg edema he was started on diuretics with Lasix, with resolution of leg edema.  Due to A-fib with RVR, was started on amiodarone, patient did not want to be on this due to side effect profile.  Overall did well and he presents here for 60-monthoffice visit.  1. Chronic diastolic (congestive) heart failure (HCC) ***  2. Permanent atrial fibrillation (HCC) ***  3. Primary hypertension ***  4. Stage 3a chronic kidney disease (HCC) ***  Bilateral leg edema Bilateral lower extremity edema has significantly improved since last visit.  No evidence of heart failure on physical exam. Since starting Lasix 40 mg twice daily he has been urinating approximately every 15 to 30 minutes, therefore will change Lasix to 40 mg daily.  Advised patient that he may take an additional Lasix 40 mg if bilateral lower extremity edema worsens. Reviewed results of recent labs proBNP did slightly elevate from previous.  Kidney function remained stable. We will recheck BMP prior to next visit. Reviewed results of echocardiogram that showed LVEF of 60% and mild to moderate tricuspid regurgitation, no  significant change from previous study.  Longstanding persistent atrial fibrillation ([place] He remains in A-fib with now controlled ventricular response at 82 bpm. He does not want to take amiodarone therefore we will discontinue this. Continues on Xarelto 20 mg daily without bleeding diathesis.  Primary hypertension Blood pressure is controlled at today's visit.   Follow-up in 6 weeks or sooner if needed.    JAdrian Prows MD, [facility]1/17/2024, 8:27 AM Office: [phone]: [phone]: [phone]

## 2022-10-29 NOTE — Progress Notes (Signed)
Called patient no answer left a vm

## 2022-11-01 ENCOUNTER — Other Ambulatory Visit: Payer: Self-pay

## 2022-11-01 ENCOUNTER — Inpatient Hospital Stay (HOSPITAL_BASED_OUTPATIENT_CLINIC_OR_DEPARTMENT_OTHER)
Admission: EM | Admit: 2022-11-01 | Discharge: 2022-11-05 | DRG: 291 | Disposition: A | Discharge status: Home / Self Care | Payer: Medicare Other | Attending: Internal Medicine | Admitting: Internal Medicine

## 2022-11-01 ENCOUNTER — Encounter (HOSPITAL_COMMUNITY): Payer: Self-pay

## 2022-11-01 ENCOUNTER — Emergency Department (HOSPITAL_BASED_OUTPATIENT_CLINIC_OR_DEPARTMENT_OTHER): Payer: Medicare Other | Admitting: Radiology

## 2022-11-01 DIAGNOSIS — N179 Acute kidney failure, unspecified: Secondary | ICD-10-CM | POA: Diagnosis present

## 2022-11-01 DIAGNOSIS — J90 Pleural effusion, not elsewhere classified: Secondary | ICD-10-CM | POA: Diagnosis not present

## 2022-11-01 DIAGNOSIS — Z800 Family history of malignant neoplasm of digestive organs: Secondary | ICD-10-CM | POA: Diagnosis not present

## 2022-11-01 DIAGNOSIS — I4821 Permanent atrial fibrillation: Secondary | ICD-10-CM | POA: Diagnosis present

## 2022-11-01 DIAGNOSIS — I110 Hypertensive heart disease with heart failure: Principal | ICD-10-CM | POA: Diagnosis present

## 2022-11-01 DIAGNOSIS — E876 Hypokalemia: Secondary | ICD-10-CM | POA: Diagnosis present

## 2022-11-01 DIAGNOSIS — I4819 Other persistent atrial fibrillation: Secondary | ICD-10-CM | POA: Diagnosis not present

## 2022-11-01 DIAGNOSIS — Z7901 Long term (current) use of anticoagulants: Secondary | ICD-10-CM | POA: Diagnosis not present

## 2022-11-01 DIAGNOSIS — I4510 Unspecified right bundle-branch block: Secondary | ICD-10-CM | POA: Diagnosis not present

## 2022-11-01 DIAGNOSIS — Z79899 Other long term (current) drug therapy: Secondary | ICD-10-CM

## 2022-11-01 DIAGNOSIS — N400 Enlarged prostate without lower urinary tract symptoms: Secondary | ICD-10-CM | POA: Diagnosis not present

## 2022-11-01 DIAGNOSIS — I5033 Acute on chronic diastolic (congestive) heart failure: Secondary | ICD-10-CM | POA: Diagnosis not present

## 2022-11-01 DIAGNOSIS — I10 Essential (primary) hypertension: Secondary | ICD-10-CM | POA: Diagnosis not present

## 2022-11-01 DIAGNOSIS — J9811 Atelectasis: Secondary | ICD-10-CM | POA: Diagnosis not present

## 2022-11-01 DIAGNOSIS — Z841 Family history of disorders of kidney and ureter: Secondary | ICD-10-CM

## 2022-11-01 DIAGNOSIS — Z806 Family history of leukemia: Secondary | ICD-10-CM | POA: Diagnosis not present

## 2022-11-01 DIAGNOSIS — F419 Anxiety disorder, unspecified: Secondary | ICD-10-CM | POA: Diagnosis present

## 2022-11-01 DIAGNOSIS — Z87891 Personal history of nicotine dependence: Secondary | ICD-10-CM

## 2022-11-01 DIAGNOSIS — I4891 Unspecified atrial fibrillation: Secondary | ICD-10-CM | POA: Diagnosis present

## 2022-11-01 DIAGNOSIS — I509 Heart failure, unspecified: Principal | ICD-10-CM

## 2022-11-01 DIAGNOSIS — Z8249 Family history of ischemic heart disease and other diseases of the circulatory system: Secondary | ICD-10-CM | POA: Diagnosis not present

## 2022-11-01 DIAGNOSIS — Z807 Family history of other malignant neoplasms of lymphoid, hematopoietic and related tissues: Secondary | ICD-10-CM | POA: Diagnosis not present

## 2022-11-01 DIAGNOSIS — Z888 Allergy status to other drugs, medicaments and biological substances status: Secondary | ICD-10-CM

## 2022-11-01 DIAGNOSIS — E7800 Pure hypercholesterolemia, unspecified: Secondary | ICD-10-CM | POA: Diagnosis not present

## 2022-11-01 LAB — CBC WITH DIFFERENTIAL/PLATELET
Abs Immature Granulocytes: 0.01 10*3/uL (ref 0.00–0.07)
Basophils Absolute: 0 10*3/uL (ref 0.0–0.1)
Basophils Relative: 1 %
Eosinophils Absolute: 0.1 10*3/uL (ref 0.0–0.5)
Eosinophils Relative: 2 %
HCT: 43.7 % (ref 39.0–52.0)
Hemoglobin: 14.2 g/dL (ref 13.0–17.0)
Immature Granulocytes: 0 %
Lymphocytes Relative: 25 %
Lymphs Abs: 1.4 10*3/uL (ref 0.7–4.0)
MCH: 32.9 pg (ref 26.0–34.0)
MCHC: 32.5 g/dL (ref 30.0–36.0)
MCV: 101.2 fL — ABNORMAL HIGH (ref 80.0–100.0)
Monocytes Absolute: 0.5 10*3/uL (ref 0.1–1.0)
Monocytes Relative: 10 %
Neutro Abs: 3.5 10*3/uL (ref 1.7–7.7)
Neutrophils Relative %: 62 %
Platelets: 203 10*3/uL (ref 150–400)
RBC: 4.32 MIL/uL (ref 4.22–5.81)
RDW: 14.3 % (ref 11.5–15.5)
WBC: 5.6 10*3/uL (ref 4.0–10.5)
nRBC: 0 % (ref 0.0–0.2)

## 2022-11-01 LAB — BASIC METABOLIC PANEL
Anion gap: 11 (ref 5–15)
BUN: 23 mg/dL (ref 8–23)
CO2: 26 mmol/L (ref 22–32)
Calcium: 9.1 mg/dL (ref 8.9–10.3)
Chloride: 101 mmol/L (ref 98–111)
Creatinine, Ser: 1.02 mg/dL (ref 0.61–1.24)
GFR, Estimated: >60 mL/min (ref >60)
Glucose, Bld: 154 mg/dL — ABNORMAL HIGH (ref 70–99)
Potassium: 3.8 mmol/L (ref 3.5–5.1)
Sodium: 138 mmol/L (ref 135–145)

## 2022-11-01 LAB — URINALYSIS, ROUTINE W REFLEX MICROSCOPIC
Bilirubin Urine: NEGATIVE
Glucose, UA: NEGATIVE mg/dL
Hgb urine dipstick: NEGATIVE
Ketones, ur: NEGATIVE mg/dL
Leukocytes,Ua: NEGATIVE
Nitrite: NEGATIVE
Protein, ur: NEGATIVE mg/dL
Specific Gravity, Urine: 1.01 (ref 1.005–1.030)
pH: 6.5 (ref 5.0–8.0)

## 2022-11-01 LAB — TROPONIN I (HIGH SENSITIVITY)
Troponin I (High Sensitivity): 33 ng/L — ABNORMAL HIGH (ref <18)
Troponin I (High Sensitivity): 33 ng/L — ABNORMAL HIGH (ref <18)

## 2022-11-01 LAB — BRAIN NATRIURETIC PEPTIDE: B Natriuretic Peptide: 512.7 pg/mL — ABNORMAL HIGH (ref 0.0–100.0)

## 2022-11-01 MED ORDER — DILTIAZEM LOAD VIA INFUSION
10.0000 mg | Freq: Once | INTRAVENOUS | Status: AC
  Administered 2022-11-01: 10 mg via INTRAVENOUS
  Filled 2022-11-01: qty 10

## 2022-11-01 MED ORDER — SENNOSIDES-DOCUSATE SODIUM 8.6-50 MG PO TABS: 1.0000 | ORAL_TABLET | Freq: Every evening | ORAL | Status: DC | PRN

## 2022-11-01 MED ORDER — ONDANSETRON HCL 4 MG PO TABS: 4.0000 mg | ORAL_TABLET | Freq: Four times a day (QID) | ORAL | Status: DC | PRN

## 2022-11-01 MED ORDER — MELATONIN 3 MG PO TABS
3.0000 mg | ORAL_TABLET | Freq: Every day | ORAL | Status: DC
  Administered 2022-11-01 – 2022-11-04 (×4): 3 mg via ORAL
  Filled 2022-11-01 (×4): qty 1

## 2022-11-01 MED ORDER — ATORVASTATIN CALCIUM 10 MG PO TABS
20.0000 mg | ORAL_TABLET | Freq: Every day | ORAL | Status: DC
  Administered 2022-11-02 – 2022-11-05 (×4): 20 mg via ORAL
  Filled 2022-11-01 (×4): qty 2

## 2022-11-01 MED ORDER — ACETAMINOPHEN 325 MG PO TABS: 650.0000 mg | ORAL_TABLET | Freq: Four times a day (QID) | ORAL | Status: DC | PRN

## 2022-11-01 MED ORDER — FUROSEMIDE 10 MG/ML IJ SOLN
40.0000 mg | Freq: Two times a day (BID) | INTRAMUSCULAR | Status: DC
  Administered 2022-11-01 – 2022-11-03 (×4): 40 mg via INTRAVENOUS
  Filled 2022-11-01 (×4): qty 4

## 2022-11-01 MED ORDER — SODIUM CHLORIDE 0.9% FLUSH
3.0000 mL | Freq: Two times a day (BID) | INTRAVENOUS | Status: DC
  Administered 2022-11-01 – 2022-11-04 (×7): 3 mL via INTRAVENOUS

## 2022-11-01 MED ORDER — NON FORMULARY: 8.0000 mg | Freq: Every day | Status: DC

## 2022-11-01 MED ORDER — ONDANSETRON HCL 4 MG/2ML IJ SOLN: 4.0000 mg | Freq: Four times a day (QID) | INTRAMUSCULAR | Status: DC | PRN

## 2022-11-01 MED ORDER — POTASSIUM CHLORIDE CRYS ER 10 MEQ PO TBCR
10.0000 meq | EXTENDED_RELEASE_TABLET | Freq: Every day | ORAL | Status: DC
  Administered 2022-11-02: 10 meq via ORAL
  Filled 2022-11-01: qty 1

## 2022-11-01 MED ORDER — SPIRONOLACTONE 25 MG PO TABS
25.0000 mg | ORAL_TABLET | Freq: Every morning | ORAL | Status: DC
  Administered 2022-11-02 – 2022-11-05 (×4): 25 mg via ORAL
  Filled 2022-11-01 (×4): qty 1

## 2022-11-01 MED ORDER — ALPRAZOLAM 0.25 MG PO TABS
0.2500 mg | ORAL_TABLET | Freq: Every day | ORAL | Status: DC | PRN
  Administered 2022-11-02 – 2022-11-04 (×3): 0.25 mg via ORAL
  Filled 2022-11-01 (×3): qty 1

## 2022-11-01 MED ORDER — OLMESARTAN MEDOXOMIL-HCTZ 40-25 MG PO TABS: 1.0000 | ORAL_TABLET | Freq: Every day | ORAL | Status: DC

## 2022-11-01 MED ORDER — DILTIAZEM HCL-DEXTROSE 125-5 MG/125ML-% IV SOLN (PREMIX)
5.0000 mg/h | INTRAVENOUS | Status: DC
  Administered 2022-11-01: 5 mg/h via INTRAVENOUS
  Filled 2022-11-01: qty 125

## 2022-11-01 MED ORDER — METOPROLOL TARTRATE 50 MG PO TABS
50.0000 mg | ORAL_TABLET | Freq: Two times a day (BID) | ORAL | Status: DC
  Administered 2022-11-01 – 2022-11-05 (×7): 50 mg via ORAL
  Filled 2022-11-01 (×8): qty 1

## 2022-11-01 MED ORDER — RIVAROXABAN 20 MG PO TABS
20.0000 mg | ORAL_TABLET | Freq: Every day | ORAL | Status: DC
  Administered 2022-11-02 – 2022-11-03 (×2): 20 mg via ORAL
  Filled 2022-11-01 (×2): qty 1

## 2022-11-01 MED ORDER — FUROSEMIDE 10 MG/ML IJ SOLN
40.0000 mg | Freq: Once | INTRAMUSCULAR | Status: AC
  Administered 2022-11-01: 40 mg via INTRAVENOUS
  Filled 2022-11-01: qty 4

## 2022-11-01 MED ORDER — ACETAMINOPHEN 650 MG RE SUPP: 650.0000 mg | Freq: Four times a day (QID) | RECTAL | Status: DC | PRN

## 2022-11-01 MED ORDER — FINASTERIDE 5 MG PO TABS
5.0000 mg | ORAL_TABLET | Freq: Every day | ORAL | Status: DC
  Administered 2022-11-02 – 2022-11-05 (×4): 5 mg via ORAL
  Filled 2022-11-01 (×4): qty 1

## 2022-11-01 NOTE — Plan of Care (Signed)
  Problem: Activity: Goal: Ability to tolerate increased activity will improve Outcome: Progressing   Problem: Education: Goal: Knowledge of disease or condition will improve Outcome: Progressing

## 2022-11-01 NOTE — ED Notes (Signed)
Son in law Tenna Child notified of patient bed at [facility]

## 2022-11-01 NOTE — ED Triage Notes (Signed)
Patient arrives with complaints of groin swelling/discomfort x2 days.   No injuries/rashes, per patient.

## 2022-11-01 NOTE — Hospital Course (Addendum)
NIMROD WENDT was admitted to the hospital with the working diagnosis of heart failure exacerbation.    89 y.o. male with medical history significant for persistent atrial fibrillation on Xarelto, HFpEF (EF 60% 08/25/2022), HTN, HLD, anxiety, BPH who presented with scrotal edema. Reported progressive lower extremity edema, that progressed to penile and scrotal edema, prompting him to come to the ED. Apparently he has not been taking furosemide at home.  On his initial physical examination his blood pressure is 156/114, HR 119, RR 18, 02 sat 100%. Lungs with no wheezing, or rales, heart with S1 and S2 present, irregularly irregular, abdomen with no distention, positive lower extremity edema.   Na 138, K 3,8 Cl 101 bicarbonate 26 glucose 154 bun 23 cr 1,0 BNP 512,7  High sensitive troponin 33  Wbc 5,6 hgb 14,2 plt 203   Urine analysis with SG 1,010, negative protein, and negative leukocytes.   Chest radiograph with mild cardiomegaly, increase lung markings bilaterally with fluid in the right fissure and small right pleural effusion.   EKG 134 bpm, right axis deviation, qtc 508, right bundle branch block, atrial fibrillation, with no significant ST segment or T wave changes.   Patient was placed on IV furosemide for diuresis with improvement in his symptoms.

## 2022-11-01 NOTE — ED Notes (Signed)
Call Tenna Child when patient is sent over to [facility].

## 2022-11-01 NOTE — H&P (Signed)
History and Physical    Sean Lindsey [id number] DOB: [birth year] DOA: 11/01/2022  PCP: Holland Commons, [place]  Patient coming from: Home  I have personally briefly reviewed patient's old medical records in [place]  Chief Complaint: Scrotal edema  HPI: Sean Lindsey is a 89 y.o. male with medical history significant for persistent atrial fibrillation on Xarelto, HFpEF (EF 60% 08/25/2022), HTN, HLD, anxiety, BPH who presented to the ED for evaluation of scrotal swelling.  Patient states that he has been having progressive swelling of his lower extremities ongoing for some time.  Has not really had much dyspnea and can ambulate fairly well with the use of a walker.  He has not had chest pain or noticeable palpitations.  Over the last day he noticed penile and scrotal edema which prompted him to go to the ED for further evaluation.  He reports good urine output and has not had any dysuria, penile discharge, scrotal pain.  He follows with [facility] cardiology, Dr. Einar Gip.  Has been on Lasix 40 mg BID and metoprolol 50 mg BID.  On Xarelto for anticoagulation.  Was previously on amiodarone which was discontinued on last clinic visit per patient preference.  [place] ED Course  Labs/Imaging on admission: I have personally reviewed following labs and imaging studies.  Initial vitals showed BP 156/114, pulse 119, RR 18, temp 97.6 F, SpO2 100% on room air.  HR as high as 158.  Labs showed BNP 512.7, troponin 33 x 2, sodium 138, potassium 3.8, bicarb 26, BUN 23, creatinine 1.02, serum glucose 154, WBC 5.6, hemoglobin 14.2, platelets 203,000.  UA negative.  Portable chest x-ray showed small bilateral pleural effusions with adjacent basilar atelectasis.  Interstitial central opacities noted.  Patient was given IV Lasix 40 mg, IV diltiazem 10 mg followed by continuous infusion.  The hospitalist service was consulted to admit for further evaluation and management.  Review of  Systems: All systems reviewed and are negative except as documented in history of present illness above.   Past Medical History:  Diagnosis Date   Anxiety    Arrhythmia    BPH (benign prostatic hyperplasia)    Hypercholesterolemia    Hyperglycemia    Hyperlipidemia    Hyperplastic colon polyp    Hypertension    Benign   Internal hemorrhoids    Lipoma    Near cecum   Lung nodules 11/28/2010   History of   Other abnormal glucose    PSA elevation     Past Surgical History:  Procedure Laterality Date   Left thyroid nodule     FNA    Social History:  reports that he quit smoking about 38 years ago. His smoking use included cigarettes. He has a 20.00 pack-year smoking history. He has never used smokeless tobacco. He reports current alcohol use of about 1.0 standard drink of alcohol per week. He reports that he does not use drugs.  Allergies  Allergen Reactions   Flomax [Tamsulosin Hcl] Other (See Comments)    Fainting   Uroxatral [Alfuzosin] Other (See Comments)    Made patient feel bad.    Family History  Problem Relation Age of Onset   Kidney failure Father        Deceased   Heart disease Sister    Liver cancer Brother    Gastric cancer Mother    Leukemia Brother    Lymphoma Sister    Lymphoma Brother    Gastric cancer Brother  Prior to Admission medications   Medication Sig Start Date End Date Taking? Authorizing Provider  ALPRAZolam Duanne Moron) 0.25 MG tablet Take 0.25 mg by mouth daily as needed for anxiety.     [provider]  atorvastatin (LIPITOR) 20 MG tablet Take 20 mg by mouth daily.    [provider]  finasteride (PROSCAR) 5 MG tablet Take 5 mg by mouth daily.    [provider]  furosemide (LASIX) 40 MG tablet Take 1 tablet (40 mg total) by mouth 2 (two) times daily. 08/24/22 02/20/23  Ernst Spell, NP  melatonin 3 MG TABS tablet Take 1 tablet by mouth at bedtime.    [provider]  metoprolol tartrate  (LOPRESSOR) 50 MG tablet Take 1 tablet (50 mg total) by mouth 2 (two) times daily. 11/06/19   Adrian Prows, MD  olmesartan-hydrochlorothiazide (BENICAR HCT) 40-25 MG tablet Take 1 tablet by mouth daily.     [provider]  potassium chloride (KLOR-CON) 10 MEQ tablet Take 1 tablet (10 mEq total) by mouth daily. 08/24/22 09/23/22  Ernst Spell, NP  silodosin (RAPAFLO) 8 MG CAPS capsule Take 8 mg by mouth daily with breakfast.     [provider]  spironolactone (ALDACTONE) 25 MG tablet TAKE ONE TABLET EVERY MORNING 07/12/22   Adrian Prows, MD  XARELTO 20 MG TABS tablet TAKE 1 TABLET DAILY WITH SUPPER 07/02/22   Adrian Prows, MD    Physical Exam: Vitals:   11/01/22 1800 11/01/22 1815 11/01/22 1830 11/01/22 2137  BP: 113/65 115/71 129/77   Pulse: 82 84 76   Resp: '17 19 19   '$ Temp:    (!) 97.3 F (36.3 C)  TempSrc:    Oral  SpO2: 98% 95% 95%   Weight:    80.1 kg  Height:    '5\' 11"'$  (1.803 m)   Constitutional: Elderly man resting in bed.  NAD, calm, comfortable Eyes: EOMI, lids and conjunctivae normal ENMT: Mucous membranes are moist. Posterior pharynx clear of any exudate or lesions.Normal dentition.  Neck: normal, supple, no masses. Respiratory: clear to auscultation bilaterally, no wheezing, no crackles. Normal respiratory effort. No accessory muscle use.  Cardiovascular: Mildly irregular, no murmurs / rubs / gallops.  +2 bilateral lower extremity edema. 2+ pedal pulses. Abdomen: no tenderness, no masses palpated. GU: Pure wick in place, no significant appreciable penile or scrotal edema.  Patient states edema has improved after receiving IV Lasix. Musculoskeletal: no clubbing / cyanosis. No joint deformity upper and lower extremities. Good ROM, no contractures. Normal muscle tone.  Skin: no rashes, lesions, ulcers. No induration Neurologic:  Sensation intact. Strength 5/5 in all 4.  Psychiatric: Normal judgment and insight. Alert and oriented x 3. Normal mood.   EKG:  Personally reviewed.  Atrial fibrillation with RVR, rate 134.  Rate is faster when compared to prior.  Assessment/Plan Principal Problem:   Persistent atrial fibrillation with rapid ventricular response (HCC) Active Problems:   Acute on chronic heart failure with preserved ejection fraction (HFpEF) ([place])   Primary hypertension   Pure hypercholesterolemia   Anxiety   BPH (benign prostatic hyperplasia)   Sean Lindsey is a 89 y.o. male with medical history significant for persistent atrial fibrillation on Xarelto, HFpEF (EF 60% 08/25/2022), HTN, HLD, anxiety, BPH who is admitted with A-fib with RVR associated with acute on chronic HFpEF.  Assessment and Plan: * Persistent atrial fibrillation with rapid ventricular response (HCC) Presenting with A-fib RVR.  Started on IV diltiazem with improved heart rate. -Resume home  Lopressor 50 mg twice daily -Titrate off IV diltiazem infusion -Continue Xarelto  Acute on chronic heart failure with preserved ejection fraction (HFpEF) ([place]) Presenting with peripheral edema bilateral lower extremities with scrotal edema.  BNP elevated.  CXR with small bilateral pleural effusions and interstitial edema.  Denies significant respiratory symptoms and saturating well on room air.  TTE 08/25/2022 showed EF 60%, mild to moderate TR. -Continue IV Lasix 40 mg twice daily -Continue Lopressor 50 mg twice daily -Strict I/O's and daily weights  Primary hypertension Resume home Lopressor, olmesartan-HCTZ, spironolactone.  BPH (benign prostatic hyperplasia) Continue Proscar and Rapaflo.  Anxiety Continue home Xanax 0.25 mg daily as needed.  Pure hypercholesterolemia Continue atorvastatin.  DVT prophylaxis:  rivaroxaban (XARELTO) tablet 20 mg   Code Status: Full code, confirmed with patient on admission Family Communication: Discussed with patient, he has discussed with family Disposition Plan: From home, dispo pending clinical progress Consults called:  None Severity of Illness: The appropriate patient status for this patient is OBSERVATION. Observation status is judged to be reasonable and necessary in order to provide the required intensity of service to ensure the patient's safety. The patient's presenting symptoms, physical exam findings, and initial radiographic and laboratory data in the context of their medical condition is felt to place them at decreased risk for further clinical deterioration. Furthermore, it is anticipated that the patient will be medically stable for discharge from the hospital within 2 midnights of admission.   Zada Finders MD [facility]  If 7PM-7AM, please contact night-coverage [email or web address]  11/01/2022, 10:42 PM

## 2022-11-01 NOTE — ED Notes (Signed)
Report given to [place], Therapist, sports.

## 2022-11-01 NOTE — Assessment & Plan Note (Addendum)
08/2022 echocardiogram with preserved LV systolic function with EF 60%, with mild concentric hypertrophy.  Mild to moderate TR. RVSP 37   Patient was placed on IV furosemide for diuresis, negative fluid balance was achieved, losing 5 kg weight with significant improvement in her symptoms.  Patient had unna boots placed for peripheral edema with good toleration.  Continue with metoprolol and spironolactone. Continue diuresis with oral furosemide.  No SGLT2 inh due to scrotal edema.

## 2022-11-01 NOTE — Assessment & Plan Note (Signed)
Continue home Xanax 0.25 mg daily as needed.

## 2022-11-01 NOTE — ED Provider Notes (Signed)
[facility] Provider Note   CSN: [id number] Arrival date & time: 11/01/22  1050     History  Chief Complaint  Patient presents with   Groin Pain    Sean Lindsey is a 89 y.o. male.  Patient with a history of hypertension, hyperlipidemia, BPH, anxiety, A-fib on Xarelto presenting with swelling of his penis and scrotum for the past 2 days.  Denies any pain.  States is difficult to urinate because of swelling around his penis and scrotum.  No chest pain or shortness of breath but does feel somewhat short of breath when he walks around has difficulty lying flat.  Has noticed increased leg swelling as well.  Denies any cough or fever.  States he is swollen to his scrotum and testicles but does not have any significant pain. No pain with urination.  No testicular pain.  No penile discharge. He is uncertain what medications he takes but does have Lasix and spironolactone on his medication list.  Denies any missed doses of Xarelto.  Found to be in A-fib with RVR on arrival.  The history is provided by the patient and a relative.  Groin Pain Associated symptoms include shortness of breath. Pertinent negatives include no chest pain, no abdominal pain and no headaches.       Home Medications Prior to Admission medications   Medication Sig Start Date End Date Taking? Authorizing Provider  ALPRAZolam Duanne Moron) 0.25 MG tablet Take 0.25 mg by mouth daily as needed for anxiety.     [provider]  atorvastatin (LIPITOR) 20 MG tablet Take 20 mg by mouth daily.    [provider]  finasteride (PROSCAR) 5 MG tablet Take 5 mg by mouth daily.    [provider]  furosemide (LASIX) 40 MG tablet Take 1 tablet (40 mg total) by mouth 2 (two) times daily. 08/24/22 02/20/23  Ernst Spell, NP  melatonin 3 MG TABS tablet Take 1 tablet by mouth at bedtime.    [provider]  metoprolol tartrate (LOPRESSOR) 50 MG tablet Take  1 tablet (50 mg total) by mouth 2 (two) times daily. 11/06/19   Adrian Prows, MD  olmesartan-hydrochlorothiazide (BENICAR HCT) 40-25 MG tablet Take 1 tablet by mouth daily.     [provider]  potassium chloride (KLOR-CON) 10 MEQ tablet Take 1 tablet (10 mEq total) by mouth daily. 08/24/22 09/23/22  Ernst Spell, NP  silodosin (RAPAFLO) 8 MG CAPS capsule Take 8 mg by mouth daily with breakfast.     [provider]  spironolactone (ALDACTONE) 25 MG tablet TAKE ONE TABLET EVERY MORNING 07/12/22   Adrian Prows, MD  XARELTO 20 MG TABS tablet TAKE 1 TABLET DAILY WITH SUPPER 07/02/22   Adrian Prows, MD      Allergies    Flomax [tamsulosin hcl] and Uroxatral [alfuzosin]    Review of Systems   Review of Systems  Constitutional:  Negative for activity change, appetite change and fatigue.  HENT:  Negative for congestion.   Respiratory:  Positive for shortness of breath.   Cardiovascular:  Positive for leg swelling. Negative for chest pain.  Gastrointestinal:  Negative for abdominal pain, nausea and vomiting.  Genitourinary:  Positive for penile swelling and scrotal swelling. Negative for difficulty urinating, dysuria and hematuria.  Musculoskeletal:  Negative for arthralgias and myalgias.  Neurological:  Negative for dizziness, weakness and headaches.   all other systems are negative except as noted in the HPI and PMH.    Physical  Exam Updated Vital Signs BP (!) 156/114 (BP Location: Left Arm)   Pulse (!) 119   Temp 97.6 F (36.4 C) (Oral)   Resp 18   Ht '5\' 11"'$  (1.803 m)   Wt 81.6 kg   SpO2 100%   BMI 25.10 kg/m  Physical Exam Vitals and nursing note reviewed.  Constitutional:      General: He is not in acute distress.    Appearance: He is well-developed. He is not ill-appearing.  HENT:     Head: Normocephalic and atraumatic.     Mouth/Throat:     Pharynx: No oropharyngeal exudate.  Eyes:     Conjunctiva/sclera: Conjunctivae normal.     Pupils: Pupils are equal,  round, and reactive to light.  Neck:     Comments: No meningismus. Cardiovascular:     Rate and Rhythm: Tachycardia present. Rhythm irregular.     Heart sounds: Normal heart sounds. No murmur heard.    Comments: Irregular tachycardia Pulmonary:     Effort: Pulmonary effort is normal. No respiratory distress.     Breath sounds: Normal breath sounds.  Chest:     Chest wall: No tenderness.  Abdominal:     Palpations: Abdomen is soft.     Tenderness: There is no abdominal tenderness. There is no guarding or rebound.  Genitourinary:    Comments: Edema to penile shaft and scrotum, nontender Musculoskeletal:        General: No tenderness. Normal range of motion.     Cervical back: Normal range of motion and neck supple.     Right lower leg: Edema present.     Left lower leg: Edema present.     Comments: +3 edema to knees bilaterally  Skin:    General: Skin is warm.  Neurological:     Mental Status: He is alert and oriented to person, place, and time.     Cranial Nerves: No cranial nerve deficit.     Motor: No abnormal muscle tone.     Coordination: Coordination normal.     Comments: No ataxia on finger to nose bilaterally. No pronator drift. 5/5 strength throughout. CN 2-12 intact.Equal grip strength. Sensation intact.   Psychiatric:        Behavior: Behavior normal.     ED Results / Procedures / Treatments   Labs (all labs ordered are listed, but only abnormal results are displayed) Labs Reviewed  CBC WITH DIFFERENTIAL/PLATELET - Abnormal; Notable for the following components:      Result Value   MCV 101.2 (*)    All other components within normal limits  BASIC METABOLIC PANEL - Abnormal; Notable for the following components:   Glucose, Bld 154 (*)    All other components within normal limits  BRAIN NATRIURETIC PEPTIDE - Abnormal; Notable for the following components:   B Natriuretic Peptide 512.7 (*)    All other components within normal limits  TROPONIN I (HIGH  SENSITIVITY) - Abnormal; Notable for the following components:   Troponin I (High Sensitivity) 33 (*)    All other components within normal limits  TROPONIN I (HIGH SENSITIVITY) - Abnormal; Notable for the following components:   Troponin I (High Sensitivity) 33 (*)    All other components within normal limits  URINALYSIS, ROUTINE W REFLEX MICROSCOPIC    EKG EKG Interpretation  Date/Time:  Monday November 01 2022 11:19:04 EST Ventricular Rate:  134 PR Interval:    QRS Duration: 111 QT Interval:  340 QTC Calculation: 508 R Axis:   257  Text Interpretation: Atrial fibrillation Incomplete RBBB and LAFB Right ventricular hypertrophy Abnormal lateral Q waves Prolonged QT interval Atrial fibrillation with RVR Confirmed by Ezequiel Essex [phone]) on 11/01/2022 3:52:56 PM  Radiology DG Chest Port 1 View  Result Date: 11/01/2022 CLINICAL DATA:  Edema EXAM: PORTABLE CHEST 1 VIEW COMPARISON:  Radiograph and CT 12/11/2018 FINDINGS: Unchanged cardiomediastinal silhouette. There are small bilateral pleural effusions, right greater than left with adjacent basilar opacities. Moderate central opacities. Skin fold overlies the right chest. No evidence of pneumothorax. Bilateral shoulder degenerative changes. Thoracic spondylosis. IMPRESSION: Small bilateral pleural effusions with adjacent basilar atelectasis. Interstitial opacities which could reflect a degree of interstitial edema Electronically Signed   By: Maurine Simmering M.D.   On: 11/01/2022 12:10    Procedures .Critical Care  Performed by: Ezequiel Essex, MD Authorized by: Ezequiel Essex, MD   Critical care provider statement:    Critical care time (minutes):  40   Critical care time was exclusive of:  Separately billable procedures and treating other patients   Critical care was necessary to treat or prevent imminent or life-threatening deterioration of the following conditions:  Cardiac failure   Critical care was time spent personally by me  on the following activities:  Development of treatment plan with patient or surrogate, discussions with consultants, evaluation of patient's response to treatment, examination of patient, ordering and review of laboratory studies, ordering and review of radiographic studies, ordering and performing treatments and interventions, pulse oximetry, re-evaluation of patient's condition, review of old charts, blood draw for specimens and obtaining history from patient or surrogate   I assumed direction of critical care for this patient from another provider in my specialty: no     Care discussed with: admitting provider       Medications Ordered in ED Medications  diltiazem (CARDIZEM) 1 mg/mL load via infusion 10 mg (has no administration in time range)    And  diltiazem (CARDIZEM) 125 mg in dextrose 5% 125 mL (1 mg/mL) infusion (has no administration in time range)  furosemide (LASIX) injection 40 mg (has no administration in time range)    ED Course/ Medical Decision Making/ A&P                             Medical Decision Making Amount and/or Complexity of Data Reviewed Labs: ordered. Decision-making details documented in ED Course. Radiology: ordered and independent interpretation performed. Decision-making details documented in ED Course. ECG/medicine tests: ordered and independent interpretation performed. Decision-making details documented in ED Course.  Risk Prescription drug management. Decision regarding hospitalization.  Penile swelling and scrotal swelling for the past 2 days with leg swelling as well.  A-fib with RVR on arrival.  No hypoxia or increased work of breathing.  Does not feel short of breath.  Suspect likely CHF exacerbation causing penile and scrotal swelling and leg swelling.  Will initiate Cardizem drip for rate control, IV Lasix, check labs and chest x-ray  Patient here with A-fib with RVR, penile swelling and scrotal swelling and leg swelling.  Concern for CHF  exacerbation being driven by atrial fibrillation.  Given IV Lasix, IV Cardizem for rate control.  Heart rate is improved to the 80s and 90s.  He is diuresing well.  Labs are reassuring with stable creatinine. Troponin remains flat.  Given patient's scrotal swelling and leg swelling we will plan admission for IV diuresis To improve his dyspnea.  Discussed with Dr. Trilby Drummer  Final Clinical Impression(s) / ED Diagnoses Final diagnoses:  Acute on chronic congestive heart failure, unspecified heart failure type [facility])    Rx / DC Orders ED Discharge Orders     None         Arianny Pun, Annie Main, MD 11/01/22 1554

## 2022-11-01 NOTE — Assessment & Plan Note (Signed)
Continue atorvastatin

## 2022-11-01 NOTE — Assessment & Plan Note (Signed)
Continue Proscar and Rapaflo.

## 2022-11-01 NOTE — Assessment & Plan Note (Addendum)
Patient required transitory IV diltiazem Rate control atrial fibrillation with metoprolol, continue rivaroxaban for anticoagulation.

## 2022-11-01 NOTE — Assessment & Plan Note (Addendum)
Blood pressure has been stable, continue with metoprolol along with spironolactone and furosemide.  Resume olmesartan HCTZ combination at the time of discharge.

## 2022-11-02 ENCOUNTER — Encounter (HOSPITAL_COMMUNITY): Payer: Self-pay | Admitting: Internal Medicine

## 2022-11-02 DIAGNOSIS — F419 Anxiety disorder, unspecified: Secondary | ICD-10-CM | POA: Diagnosis present

## 2022-11-02 DIAGNOSIS — I4891 Unspecified atrial fibrillation: Secondary | ICD-10-CM | POA: Insufficient documentation

## 2022-11-02 DIAGNOSIS — Z7901 Long term (current) use of anticoagulants: Secondary | ICD-10-CM | POA: Diagnosis not present

## 2022-11-02 DIAGNOSIS — Z841 Family history of disorders of kidney and ureter: Secondary | ICD-10-CM | POA: Diagnosis not present

## 2022-11-02 DIAGNOSIS — Z800 Family history of malignant neoplasm of digestive organs: Secondary | ICD-10-CM | POA: Diagnosis not present

## 2022-11-02 DIAGNOSIS — Z806 Family history of leukemia: Secondary | ICD-10-CM | POA: Diagnosis not present

## 2022-11-02 DIAGNOSIS — Z888 Allergy status to other drugs, medicaments and biological substances status: Secondary | ICD-10-CM | POA: Diagnosis not present

## 2022-11-02 DIAGNOSIS — E7800 Pure hypercholesterolemia, unspecified: Secondary | ICD-10-CM | POA: Diagnosis present

## 2022-11-02 DIAGNOSIS — I5033 Acute on chronic diastolic (congestive) heart failure: Secondary | ICD-10-CM | POA: Insufficient documentation

## 2022-11-02 DIAGNOSIS — Z807 Family history of other malignant neoplasms of lymphoid, hematopoietic and related tissues: Secondary | ICD-10-CM | POA: Diagnosis not present

## 2022-11-02 DIAGNOSIS — N400 Enlarged prostate without lower urinary tract symptoms: Secondary | ICD-10-CM | POA: Diagnosis present

## 2022-11-02 DIAGNOSIS — I10 Essential (primary) hypertension: Secondary | ICD-10-CM | POA: Diagnosis not present

## 2022-11-02 DIAGNOSIS — E876 Hypokalemia: Secondary | ICD-10-CM | POA: Diagnosis present

## 2022-11-02 DIAGNOSIS — I110 Hypertensive heart disease with heart failure: Secondary | ICD-10-CM | POA: Diagnosis present

## 2022-11-02 DIAGNOSIS — N179 Acute kidney failure, unspecified: Secondary | ICD-10-CM | POA: Diagnosis present

## 2022-11-02 DIAGNOSIS — Z8249 Family history of ischemic heart disease and other diseases of the circulatory system: Secondary | ICD-10-CM | POA: Diagnosis not present

## 2022-11-02 DIAGNOSIS — Z87891 Personal history of nicotine dependence: Secondary | ICD-10-CM | POA: Diagnosis not present

## 2022-11-02 DIAGNOSIS — I4819 Other persistent atrial fibrillation: Secondary | ICD-10-CM | POA: Diagnosis not present

## 2022-11-02 DIAGNOSIS — I4510 Unspecified right bundle-branch block: Secondary | ICD-10-CM | POA: Diagnosis present

## 2022-11-02 DIAGNOSIS — I4821 Permanent atrial fibrillation: Secondary | ICD-10-CM | POA: Diagnosis present

## 2022-11-02 DIAGNOSIS — Z79899 Other long term (current) drug therapy: Secondary | ICD-10-CM | POA: Diagnosis not present

## 2022-11-02 LAB — CBC
HCT: 42.6 % (ref 39.0–52.0)
Hemoglobin: 14.6 g/dL (ref 13.0–17.0)
MCH: 31.3 pg (ref 26.0–34.0)
MCHC: 34.3 g/dL (ref 30.0–36.0)
MCV: 91.4 fL (ref 80.0–100.0)
Platelets: 211 10*3/uL (ref 150–400)
RBC: 4.66 MIL/uL (ref 4.22–5.81)
RDW: 14.1 % (ref 11.5–15.5)
WBC: 6.8 10*3/uL (ref 4.0–10.5)
nRBC: 0 % (ref 0.0–0.2)

## 2022-11-02 LAB — BASIC METABOLIC PANEL
Anion gap: 11 (ref 5–15)
BUN: 18 mg/dL (ref 8–23)
CO2: 27 mmol/L (ref 22–32)
Calcium: 8.6 mg/dL — ABNORMAL LOW (ref 8.9–10.3)
Chloride: 100 mmol/L (ref 98–111)
Creatinine, Ser: 0.98 mg/dL (ref 0.61–1.24)
GFR, Estimated: >60 mL/min (ref >60)
Glucose, Bld: 118 mg/dL — ABNORMAL HIGH (ref 70–99)
Potassium: 3.1 mmol/L — ABNORMAL LOW (ref 3.5–5.1)
Sodium: 138 mmol/L (ref 135–145)

## 2022-11-02 LAB — MAGNESIUM: Magnesium: 1.6 mg/dL — ABNORMAL LOW (ref 1.7–2.4)

## 2022-11-02 MED ORDER — MAGNESIUM SULFATE 2 GM/50ML IV SOLN
2.0000 g | Freq: Once | INTRAVENOUS | Status: AC
  Administered 2022-11-02: 2 g via INTRAVENOUS
  Filled 2022-11-02: qty 50

## 2022-11-02 MED ORDER — POTASSIUM CHLORIDE CRYS ER 20 MEQ PO TBCR
40.0000 meq | EXTENDED_RELEASE_TABLET | Freq: Two times a day (BID) | ORAL | Status: AC
  Administered 2022-11-02 (×2): 40 meq via ORAL
  Filled 2022-11-02 (×2): qty 2

## 2022-11-02 MED ORDER — HYDROCHLOROTHIAZIDE 25 MG PO TABS
25.0000 mg | ORAL_TABLET | Freq: Every day | ORAL | Status: DC
  Administered 2022-11-02: 25 mg via ORAL
  Filled 2022-11-02: qty 1

## 2022-11-02 MED ORDER — IRBESARTAN 300 MG PO TABS
300.0000 mg | ORAL_TABLET | Freq: Every day | ORAL | Status: DC
  Administered 2022-11-02: 300 mg via ORAL
  Filled 2022-11-02: qty 1

## 2022-11-02 MED ORDER — SILODOSIN 8 MG PO CAPS
8.0000 mg | ORAL_CAPSULE | Freq: Every day | ORAL | Status: DC
  Administered 2022-11-02 – 2022-11-05 (×4): 8 mg via ORAL
  Filled 2022-11-02 (×5): qty 1

## 2022-11-02 NOTE — Progress Notes (Signed)
   11/02/22 1100  Mobility  Activity Ambulated with assistance to bathroom  Level of Assistance Standby assist, set-up cues, supervision of patient - no hands on  Assistive Device Front wheel walker  Distance Ambulated (ft) 20 ft  Activity Response Tolerated well  Mobility Referral Yes  $Mobility charge 1 Mobility   Mobility Specialist Progress Note  Pt in chair and agreeable. Had no c/o pain. Returned to chair w/ all needs met and call bell in reach.   Sean Lindsey Mobility Specialist  Please contact via SecureChat or Rehab office at [phone]

## 2022-11-02 NOTE — Evaluation (Signed)
Physical Therapy Evaluation Patient Details Name: Sean Lindsey MRN: [id number] DOB: 1930-08-02 Today's Date: 11/02/2022  History of Present Illness  Patient is a 89 y/o male who presents on 1/22 with scrotal edema. Admitted with A-fib with RVR and acute on chronic HF. PMH includes A-fib, HTN, anxiety.  Clinical Impression  Patient presents with mild balance deficits s/p above. Pt lives at home with his wife and reports being independent for ADLs/IADLs at baseline. Today, pt tolerated transfers and gait training with supervision for safety. Needs some cues for RW management esp for turns. Pt reports he will be using RW at home from now on for safety. VSS on RA during activity with HR ranging from 70s-105 bpm. Pt functioning close to baseline. Will follow acutely to maximize independence and mobility as well as higher level balance prior to return home.       Recommendations for follow up therapy are one component of a multi-disciplinary discharge planning process, led by the attending physician.  Recommendations may be updated based on patient status, additional functional criteria and insurance authorization.  Follow Up Recommendations No PT follow up      Assistance Recommended at Discharge Intermittent Supervision/Assistance  Patient can return home with the following  A little help with walking and/or transfers;A little help with bathing/dressing/bathroom;Assistance with cooking/housework    Equipment Recommendations None recommended by PT  Recommendations for Other Services       Functional Status Assessment Patient has had a recent decline in their functional status and demonstrates the ability to make significant improvements in function in a reasonable and predictable amount of time.     Precautions / Restrictions Precautions Precautions: Fall Restrictions Weight Bearing Restrictions: No      Mobility  Bed Mobility               General bed mobility comments: Up in  chair upon PT arrival.    Transfers Overall transfer level: Modified independent Equipment used: Rolling walker (2 wheels)               General transfer comment: Stood from chair without difficulty.    Ambulation/Gait Ambulation/Gait assistance: Supervision Gait Distance (Feet): 300 Feet Assistive device: Rolling walker (2 wheels) Gait Pattern/deviations: Step-through pattern, Decreased stride length, Shuffle   Gait velocity interpretation: 1.31 - 2.62 ft/sec, indicative of limited community ambulator   General Gait Details: Shuffling like gait with use of RW, some difficulty with turns using RW. HR stable 70s-105 bpm with activity with no SOB. SP02 96%.  Stairs            Wheelchair Mobility    Modified Rankin (Stroke Patients Only)       Balance Overall balance assessment: Needs assistance Sitting-balance support: Feet supported, No upper extremity supported Sitting balance-Leahy Scale: Good     Standing balance support: During functional activity, Bilateral upper extremity supported Standing balance-Leahy Scale: Poor                               Pertinent Vitals/Pain Pain Assessment Pain Assessment: No/denies pain    Home Living Family/patient expects to be discharged to:: Private residence Living Arrangements: Spouse/significant other Available Help at Discharge: Family;Available 24 hours/day Type of Home: House Home Access: Level entry       Home Layout: One level Home Equipment: Conservation officer, nature (2 wheels);Shower seat      Prior Function Prior Level of Function : Independent/Modified Independent  Mobility Comments: Independent, drives ADLs Comments: independent     Hand Dominance   Dominant Hand: Left    Extremity/Trunk Assessment   Upper Extremity Assessment Upper Extremity Assessment: Defer to OT evaluation    Lower Extremity Assessment Lower Extremity Assessment: Overall WFL for tasks assessed     Cervical / Trunk Assessment Cervical / Trunk Assessment: Kyphotic  Communication   Communication: No difficulties  Cognition Arousal/Alertness: Awake/alert Behavior During Therapy: WFL for tasks assessed/performed Overall Cognitive Status: Within Functional Limits for tasks assessed                                 General Comments: age appropriate        General Comments General comments (skin integrity, edema, etc.): VSS on RA.    Exercises     Assessment/Plan    PT Assessment Patient needs continued PT services  PT Problem List Decreased mobility;Decreased knowledge of use of DME;Decreased balance       PT Treatment Interventions Therapeutic activities;Gait training;Balance training;Therapeutic exercise;Patient/family education    PT Goals (Current goals can be found in the Care Plan section)  Acute Rehab PT Goals Patient Stated Goal: to go home PT Goal Formulation: With patient Time For Goal Achievement: 11/16/22 Potential to Achieve Goals: Good    Frequency Min 3X/week     Co-evaluation               AM-PAC PT "6 Clicks" Mobility  Outcome Measure Help needed turning from your back to your side while in a flat bed without using bedrails?: None Help needed moving from lying on your back to sitting on the side of a flat bed without using bedrails?: None Help needed moving to and from a bed to a chair (including a wheelchair)?: A Little Help needed standing up from a chair using your arms (e.g., wheelchair or bedside chair)?: A Little Help needed to walk in hospital room?: A Little Help needed climbing 3-5 steps with a railing? : A Little 6 Click Score: 20    End of Session Equipment Utilized During Treatment: Gait belt Activity Tolerance: Patient tolerated treatment well Patient left: in chair;with call bell/phone within reach Nurse Communication: Mobility status PT Visit Diagnosis: Difficulty in walking, not elsewhere classified  (R26.2);Unsteadiness on feet (R26.81)    Time: 1416-1440 PT Time Calculation (min) (ACUTE ONLY): 24 min   Charges:   PT Evaluation $PT Eval Low Complexity: 1 Low PT Treatments $Gait Training: 8-22 mins        Zettie Cooley, DPT Acute Rehabilitation Services Secure chat preferred Office [place] 11/02/2022, 2:48 PM

## 2022-11-02 NOTE — Progress Notes (Signed)
Heart Failure Navigator Progress Note  Assessed for Heart & Vascular TOC clinic readiness.  Patient does not meet criteria due to [facility] patient.   Navigator will sign off at this time.    Hancel Ion, BSN, RN Heart Failure Nurse Navigator Secure Chat Only   

## 2022-11-02 NOTE — Progress Notes (Signed)
PROGRESS NOTE    Sean Lindsey  ZOX:[id number] DOB: 05/26/1930 DOA: 11/01/2022 PCP: Holland Commons, FNP  89/M with history of chronic diastolic CHF, permanent A-fib on Xarelto, hypertension, dyslipidemia, anxiety, BPH presented to the ED with progressive lower extremity edema and scrotal swelling In the ED hypertensive, labs with BNP 512, troponin 33, creatinine 1.0, chest x-ray with small bilateral pleural effusions  Subjective: -Feels better, breathing is improving, swelling improving  Assessment and Plan:  Persistent atrial fibrillation with rapid ventricular response ([place]) Presenting with A-fib RVR.  Required Cardizem gtt. on admission yesterday, now off -Continue Lopressor and Xarelto -Per cards notes he has permanent A-fib  Acute on chronic diastolic CHF Presenting with peripheral edema bilateral lower extremities with scrotal edema.  BNP elevated.  CXR with small bilateral pleural effusions and interstitial edema.   - TTE 08/25/2022 showed EF 60%, mild to moderate TR. -He is 3.8 L negative, continue IV Lasix 1 more day -Continue Aldactone, hold ARB, continue Lopressor -Will avoid SGLT2i with advanced age, BPH -BMP in a.m., daily weights -PT eval  Primary hypertension Resume home Lopressor, Aldactone, hold olmesartan-HCTZ  BPH (benign prostatic hyperplasia) Continue Proscar and Rapaflo.  Anxiety Continue home Xanax 0.25 mg daily as needed.  Pure hypercholesterolemia Continue atorvastatin.  DVT prophylaxis: Xarelto Code Status: Full code Family Communication: None present Disposition Plan: Home tomorrow if stable  Consultants:    Procedures:   Antimicrobials:    Objective: Vitals:   11/01/22 2137 11/02/22 0000 11/02/22 0355 11/02/22 0728  BP:  (!) 140/77 (!) 148/78 (!) 153/85  Pulse:   78 82  Resp:  '16 15 16  '$ Temp: (!) 97.3 F (36.3 C) 98.3 F (36.8 C) 97.8 F (36.6 C) (!) 97.5 F (36.4 C)  TempSrc: Oral Axillary Oral Oral  SpO2:   98% 97%   Weight: 80.1 kg  79.5 kg   Height: '5\' 11"'$  (1.803 m)       Intake/Output Summary (Last 24 hours) at 11/02/2022 0951 Last data filed at 11/02/2022 0734 Gross per 24 hour  Intake 186.44 ml  Output 4350 ml  Net -4163.56 ml   Filed Weights   11/01/22 1059 11/01/22 2137 11/02/22 0355  Weight: 81.6 kg 80.1 kg 79.5 kg    Examination:  General exam: Appears calm and comfortable  Respiratory system: Few basilar rales Cardiovascular system: S1 & S2 heard, RRR.  Abd: nondistended, soft and nontender.Normal bowel sounds heard.  Mild scrotal edema Central nervous system: Alert and oriented. No focal neurological deficits. Extremities: Trace edema Skin: No rashes Psychiatry:  Mood & affect appropriate.     Data Reviewed:   CBC: Recent Labs  Lab 11/01/22 1155 11/02/22 0131  WBC 5.6 6.8  NEUTROABS 3.5  --   HGB 14.2 14.6  HCT 43.7 42.6  MCV 101.2* 91.4  PLT 203 811   Basic Metabolic Panel: Recent Labs  Lab 11/01/22 1155 11/02/22 0131  NA 138 138  K 3.8 3.1*  CL 101 100  CO2 26 27  GLUCOSE 154* 118*  BUN 23 18  CREATININE 1.02 0.98  CALCIUM 9.1 8.6*  MG  --  1.6*   GFR: Estimated Creatinine Clearance: 53.4 mL/min (by C-G formula based on SCr of 0.98 mg/dL). Liver Function Tests: No results for input(s): "AST", "ALT", "ALKPHOS", "BILITOT", "PROT", "ALBUMIN" in the last 168 hours. No results for input(s): "LIPASE", "AMYLASE" in the last 168 hours. No results for input(s): "AMMONIA" in the last 168 hours. Coagulation Profile: No results for input(s): "INR", "PROTIME"  in the last 168 hours. Cardiac Enzymes: No results for input(s): "CKTOTAL", "CKMB", "CKMBINDEX", "TROPONINI" in the last 168 hours. BNP (last 3 results) Recent Labs    08/24/22 1612 08/31/22 1153  PROBNP 1,573* 1,777*   HbA1C: No results for input(s): "HGBA1C" in the last 72 hours. CBG: No results for input(s): "GLUCAP" in the last 168 hours. Lipid Profile: No results for input(s): "CHOL",  "HDL", "LDLCALC", "TRIG", "CHOLHDL", "LDLDIRECT" in the last 72 hours. Thyroid Function Tests: No results for input(s): "TSH", "T4TOTAL", "FREET4", "T3FREE", "THYROIDAB" in the last 72 hours. Anemia Panel: No results for input(s): "VITAMINB12", "FOLATE", "FERRITIN", "TIBC", "IRON", "RETICCTPCT" in the last 72 hours. Urine analysis:    Component Value Date/Time   COLORURINE YELLOW 11/01/2022 1220   APPEARANCEUR CLEAR 11/01/2022 1220   LABSPEC 1.010 11/01/2022 1220   PHURINE 6.5 11/01/2022 1220   GLUCOSEU NEGATIVE 11/01/2022 1220   HGBUR NEGATIVE 11/01/2022 1220   BILIRUBINUR NEGATIVE 11/01/2022 1220   KETONESUR NEGATIVE 11/01/2022 1220   PROTEINUR NEGATIVE 11/01/2022 1220   NITRITE NEGATIVE 11/01/2022 1220   LEUKOCYTESUR NEGATIVE 11/01/2022 1220   Sepsis Labs: '@LABRCNTIP'$ (procalcitonin:4,lacticidven:4)  )No results found for this or any previous visit (from the past 240 hour(s)).   Radiology Studies: DG Chest Port 1 View  Result Date: 11/01/2022 CLINICAL DATA:  Edema EXAM: PORTABLE CHEST 1 VIEW COMPARISON:  Radiograph and CT 12/11/2018 FINDINGS: Unchanged cardiomediastinal silhouette. There are small bilateral pleural effusions, right greater than left with adjacent basilar opacities. Moderate central opacities. Skin fold overlies the right chest. No evidence of pneumothorax. Bilateral shoulder degenerative changes. Thoracic spondylosis. IMPRESSION: Small bilateral pleural effusions with adjacent basilar atelectasis. Interstitial opacities which could reflect a degree of interstitial edema Electronically Signed   By: Maurine Simmering M.D.   On: 11/01/2022 12:10     Scheduled Meds:  atorvastatin  20 mg Oral Daily   finasteride  5 mg Oral Daily   furosemide  40 mg Intravenous BID   melatonin  3 mg Oral QHS   metoprolol tartrate  50 mg Oral BID   potassium chloride  40 mEq Oral BID   rivaroxaban  20 mg Oral Q supper   silodosin  8 mg Oral Q breakfast   sodium chloride flush  3 mL  Intravenous Q12H   spironolactone  25 mg Oral q morning   Continuous Infusions:   LOS: 0 days    Time spent: 31mn    PDomenic Polite MD [facility]   11/02/2022, 9:51 AM

## 2022-11-02 NOTE — TOC Progression Note (Signed)
Transition of Care [facility]) - Progression Note    Patient Details  Name: Sean Lindsey MRN: [id number] Date of Birth: June 08, 1930  Transition of Care [facility]) CM/SW Contact  Zenon Mayo, RN Phone Number: 11/02/2022, 7:58 AM  Clinical Narrative:    From home, presents with CHF ex and persistent afib with RVR.  Conts on iv lasix, cardizem drip titrated off, was on xarelta pta.  TOC following.         Expected Discharge Plan and Services                                               Social Determinants of Health (SDOH) Interventions SDOH Screenings   Food Insecurity: No Food Insecurity (11/02/2022)  Housing: Low Risk  (11/01/2022)  Transportation Needs: No Transportation Needs (11/02/2022)  Utilities: Not At Risk (11/02/2022)  Tobacco Use: Medium Risk (11/02/2022)    Readmission Risk Interventions     No data to display

## 2022-11-02 NOTE — Plan of Care (Signed)
  Problem: Education: Goal: Knowledge of General Education information will improve Description: Including pain rating scale, medication(s)/side effects and non-pharmacologic comfort measures Outcome: Progressing   Problem: Health Behavior/Discharge Planning: Goal: Ability to manage health-related needs will improve Outcome: Progressing   Problem: Clinical Measurements: Goal: Ability to maintain clinical measurements within normal limits will improve Outcome: Progressing Goal: Will remain free from infection Outcome: Progressing Goal: Diagnostic test results will improve Outcome: Progressing Goal: Respiratory complications will improve Outcome: Progressing Goal: Cardiovascular complication will be avoided Outcome: Progressing   Problem: Activity: Goal: Risk for activity intolerance will decrease Outcome: Progressing   Problem: Nutrition: Goal: Adequate nutrition will be maintained Outcome: Progressing   Problem: Coping: Goal: Level of anxiety will decrease Outcome: Progressing   Problem: Elimination: Goal: Will not experience complications related to bowel motility Outcome: Progressing Goal: Will not experience complications related to urinary retention Outcome: Progressing   Problem: Pain Managment: Goal: General experience of comfort will improve Outcome: Progressing   Problem: Safety: Goal: Ability to remain free from injury will improve Outcome: Progressing   Problem: Skin Integrity: Goal: Risk for impaired skin integrity will decrease Outcome: Progressing   Problem: Education: Goal: Knowledge of disease or condition will improve Outcome: Progressing Goal: Understanding of medication regimen will improve Outcome: Progressing Goal: Individualized Educational Video(s) Outcome: Progressing   Problem: Activity: Goal: Ability to tolerate increased activity will improve Outcome: Progressing   Problem: Cardiac: Goal: Ability to achieve and maintain  adequate cardiopulmonary perfusion will improve Outcome: Progressing   Problem: Health Behavior/Discharge Planning: Goal: Ability to safely manage health-related needs after discharge will improve Outcome: Progressing   Problem: Education: Goal: Knowledge of disease or condition will improve Outcome: Progressing Goal: Understanding of medication regimen will improve Outcome: Progressing Goal: Individualized Educational Video(s) Outcome: Progressing   Problem: Activity: Goal: Ability to tolerate increased activity will improve Outcome: Progressing   Problem: Cardiac: Goal: Ability to achieve and maintain adequate cardiopulmonary perfusion will improve Outcome: Progressing   Problem: Health Behavior/Discharge Planning: Goal: Ability to safely manage health-related needs after discharge will improve Outcome: Progressing

## 2022-11-02 NOTE — Progress Notes (Signed)
   11/02/22 1300  Mobility  Activity Ambulated with assistance in hallway  Level of Assistance Contact guard assist, steadying assist  Assistive Device Front wheel walker  Distance Ambulated (ft) 150 ft  Activity Response Tolerated well  Mobility Referral Yes  $Mobility charge 1 Mobility   Mobility Specialist Progress Note  Pt was in chair and agreeable. Had no c/o pain throughout. Returned to chair w/ all needs met and call bell in reach.   Lucious Groves Mobility Specialist  Please contact via SecureChat or Rehab office at [phone]

## 2022-11-03 DIAGNOSIS — I10 Essential (primary) hypertension: Secondary | ICD-10-CM

## 2022-11-03 DIAGNOSIS — E7800 Pure hypercholesterolemia, unspecified: Secondary | ICD-10-CM

## 2022-11-03 DIAGNOSIS — F419 Anxiety disorder, unspecified: Secondary | ICD-10-CM

## 2022-11-03 DIAGNOSIS — N179 Acute kidney failure, unspecified: Secondary | ICD-10-CM | POA: Diagnosis present

## 2022-11-03 DIAGNOSIS — I4819 Other persistent atrial fibrillation: Secondary | ICD-10-CM | POA: Diagnosis not present

## 2022-11-03 DIAGNOSIS — E876 Hypokalemia: Secondary | ICD-10-CM | POA: Diagnosis present

## 2022-11-03 DIAGNOSIS — I5033 Acute on chronic diastolic (congestive) heart failure: Secondary | ICD-10-CM | POA: Diagnosis not present

## 2022-11-03 LAB — CBC
HCT: 42.6 % (ref 39.0–52.0)
Hemoglobin: 14.7 g/dL (ref 13.0–17.0)
MCH: 31.6 pg (ref 26.0–34.0)
MCHC: 34.5 g/dL (ref 30.0–36.0)
MCV: 91.6 fL (ref 80.0–100.0)
Platelets: 195 10*3/uL (ref 150–400)
RBC: 4.65 MIL/uL (ref 4.22–5.81)
RDW: 13.9 % (ref 11.5–15.5)
WBC: 6.7 10*3/uL (ref 4.0–10.5)
nRBC: 0 % (ref 0.0–0.2)

## 2022-11-03 LAB — BASIC METABOLIC PANEL
Anion gap: 10 (ref 5–15)
BUN: 23 mg/dL (ref 8–23)
CO2: 28 mmol/L (ref 22–32)
Calcium: 8.7 mg/dL — ABNORMAL LOW (ref 8.9–10.3)
Chloride: 98 mmol/L (ref 98–111)
Creatinine, Ser: 1.23 mg/dL (ref 0.61–1.24)
GFR, Estimated: 56 mL/min — ABNORMAL LOW (ref >60)
Glucose, Bld: 109 mg/dL — ABNORMAL HIGH (ref 70–99)
Potassium: 3.4 mmol/L — ABNORMAL LOW (ref 3.5–5.1)
Sodium: 136 mmol/L (ref 135–145)

## 2022-11-03 MED ORDER — POTASSIUM CHLORIDE CRYS ER 20 MEQ PO TBCR
40.0000 meq | EXTENDED_RELEASE_TABLET | Freq: Once | ORAL | Status: AC
  Administered 2022-11-03: 40 meq via ORAL
  Filled 2022-11-03: qty 2

## 2022-11-03 MED ORDER — FUROSEMIDE 10 MG/ML IJ SOLN
20.0000 mg | Freq: Once | INTRAMUSCULAR | Status: AC
  Administered 2022-11-03: 20 mg via INTRAVENOUS
  Filled 2022-11-03: qty 2

## 2022-11-03 MED ORDER — FUROSEMIDE 10 MG/ML IJ SOLN
60.0000 mg | Freq: Two times a day (BID) | INTRAMUSCULAR | Status: DC
  Administered 2022-11-03: 60 mg via INTRAVENOUS
  Filled 2022-11-03: qty 6

## 2022-11-03 NOTE — Progress Notes (Signed)
TRH night cross cover note:   I was notified by RN of the patient's soft blood pressure of 96/62 after receipt of 60 mg of IV Lasix, with current heart rate 87.  Currently scheduled for a dose of metoprolol.  I subsequently asked RN to hold this evening's dose of scheduled metoprolol.    Babs Bertin, DO Hospitalist

## 2022-11-03 NOTE — Plan of Care (Signed)
  Problem: Clinical Measurements: Goal: Diagnostic test results will improve Outcome: Progressing Goal: Respiratory complications will improve Outcome: Progressing   Problem: Activity: Goal: Risk for activity intolerance will decrease Outcome: Progressing

## 2022-11-03 NOTE — Progress Notes (Signed)
   11/03/22 1204  Mobility  Activity Ambulated with assistance in hallway  Level of Assistance Standby assist, set-up cues, supervision of patient - no hands on  Assistive Device Front wheel walker  Distance Ambulated (ft) 200 ft  Activity Response Tolerated well  Mobility Referral Yes  $Mobility charge 1 Mobility   Mobility Specialist Progress Note  Pt was in chair and agreeable. Had no c/o pain throughout. Left in chair w/ all needs met and call bell in reach  [place] Specialist  Please contact via [place] or Rehab office at [phone]

## 2022-11-03 NOTE — TOC Transition Note (Addendum)
Transition of Care [facility]) - CM/SW Discharge Note   Patient Details  Name: Sean Lindsey MRN: [id number] Date of Birth: [birth year]  Transition of Care [facility]) CM/SW Contact:  Zenon Mayo, RN Phone Number: 11/03/2022, 10:04 AM   Clinical Narrative:    Patient did receive iv lasix this am, per staff RN in progression he is for possible dc today to home.  He has no needs.  Per MD he needs one more day, so will dc tomorrow.         Patient Goals and CMS Choice      Discharge Placement                         Discharge Plan and Services Additional resources added to the After Visit Summary for                                       Social Determinants of Health (SDOH) Interventions SDOH Screenings   Food Insecurity: No Food Insecurity (11/02/2022)  Housing: Low Risk  (11/01/2022)  Transportation Needs: No Transportation Needs (11/02/2022)  Utilities: Not At Risk (11/02/2022)  Tobacco Use: Medium Risk (11/02/2022)     Readmission Risk Interventions     No data to display

## 2022-11-03 NOTE — Progress Notes (Signed)
  Progress Note   Patient: Sean Lindsey DOB: [birth year] DOA: 11/01/2022     1 DOS: the patient was seen and examined on 11/03/2022   Brief hospital course: Sean Lindsey was admitted to the hospital with the working diagnosis of heart failure exacerbation.    89 y.o. male with medical history significant for persistent atrial fibrillation on Xarelto, HFpEF (EF 60% 08/25/2022), HTN, HLD, anxiety, BPH who presented with scrotal edema. Reported progressive lower extremity edema, progressed to penile and scrotal edema, prompting to come to the ED. Om his initial physical examination his blood pressure is 156/114, HR 119, RR 18, 02 sat 100%. Lungs with no wheezing, or rales, heart with S1 and S2 present, irregularly irregular, abdomen with no distention, positive lower extremity edema.   Assessment and Plan: * Persistent atrial fibrillation with rapid ventricular response (HCC) Patient required transitory IV diltiazem Telemetry today with persistent atrial fibrillation rhythm with rate 80 to 90.    Acute on chronic heart failure with preserved ejection fraction (HFpEF) ([place]) 2020 echocardiogram with preserved LV systolic function RV systolic function preserved.   Urine output is documented 962 cc Systolic blood pressure 836 to 107 mmHg Continue to have significant edema a his lower extremities  Plan to continue diuresis, increase dose to 60 mg IV q12 hrs Continue blood pressure monitoring  Continue with metoprolol and spironolactone No SGLT2 inh due to scrotal edema.   Primary hypertension Continue blood pressure control with spironolactone, metoprolol and diuresis with furosemide.   Pure hypercholesterolemia Continue atorvastatin.  Anxiety Continue home Xanax 0.25 mg daily as needed.  BPH (benign prostatic hyperplasia) Continue Proscar and Rapaflo.  Hypokalemia Renal function stable with serum cr at 1,23 , K is 3,4 and serum bicarbonate at 28. Plan to continue  diuresis, follow up renal function in am. Avoid hypotension and nephrotoxic medications.         Subjective: Patient is feeling better, his scrotal edema has improved and has no dyspnea, he continue to have lower extremity edema   Physical Exam: Vitals:   11/02/22 1935 11/03/22 0430 11/03/22 0432 11/03/22 0801  BP: (!) 137/125  (!) 116/92 (!) 107/52  Pulse:   83 95  Resp:   18 18  Temp: 97.8 F (36.6 C)  (!) 97.5 F (36.4 C) 97.9 F (36.6 C)  TempSrc: Oral  Oral Oral  SpO2: 96%  99% 97%  Weight:  76.8 kg    Height:       Neurology awake and alert ENT with mild pallor Cardiovascular with S1 and S2 present, irregularly irregular with no gallops, rubs or murmurs No JVD Respiratory with mild rales at bases with no wheezing Abdomen with no distention  Positive lower extremity edema ++ pitting up to mid leg.  Data Reviewed:    Family Communication: no family at the bedside   Disposition: Status is: Inpatient   Planned Discharge Destination: Home      Author: Tawni Millers, MD 11/03/2022 10:40 AM  For on call review [email or web address].

## 2022-11-03 NOTE — Progress Notes (Signed)
Pt. BP 96/62, HR 82. Metoprolol scheduled. On call for [facility] paged to make aware.

## 2022-11-03 NOTE — Progress Notes (Signed)
   11/03/22 0900  Mobility  Activity Ambulated with assistance to bathroom  Level of Assistance Standby assist, set-up cues, supervision of patient - no hands on  Assistive Device Front wheel walker  Distance Ambulated (ft) 20 ft  Activity Response Tolerated well  Mobility Referral Yes  $Mobility charge 1 Mobility   Mobility Specialist Progress Note  Received pt in chair having no complaints and agreeable to mobility. Pt was asymptomatic throughout ambulation and returned to room w/o fault. Left in chair w/ call bell in reach and all needs met.   Lucious Groves Mobility Specialist  Please contact via SecureChat or Rehab office at [phone]

## 2022-11-03 NOTE — Plan of Care (Signed)
  Problem: Clinical Measurements: Goal: Respiratory complications will improve Outcome: Progressing   Problem: Activity: Goal: Risk for activity intolerance will decrease Outcome: Progressing

## 2022-11-03 NOTE — Consult Note (Signed)
   [facility] Inpatient Consult   11/03/2022  RECE ZECHMAN Jul 18, 1930 [id number]  [place] Organization [ACO] Patient: Medicare ACO REACH  Primary Care Provider:  [place], FNP with [facility] is listed for post hospital Transition of Care [TOC} follow up  [facility] member HF review  Patient screened for hospitalization with noted to assess for potential [place] Management service needs for post hospital transition for care coordination.  Review of patient's electronic medical record reveals patient of PT, TOC team notes and SDOH reveals no current community follow up needs.   Plan:  Continue to follow progress and disposition to assess for post hospital community care coordination/management needs.  Referral request for community care coordination:  currently no community care coordination needs  Of note, [place] does not replace or interfere with any arrangements made by the Inpatient Transition of Care team.  For questions contact:   Natividad Brood, RN BSN [place]  [phone] business mobile phone Toll free office [phone]  *[place]  [phone] Fax number: [phone] [email or web address]

## 2022-11-03 NOTE — Assessment & Plan Note (Addendum)
Hypomagnesemia. Hypokalemia  At the time of his discharge his renal function is improving, with a serum cr at 1,46 K is 4,2 and serum bicarbonate 33.  Plan to resume diuretic therapy with furosemide and spironolactone.  Follow up renal function as outpatient in 7 days.

## 2022-11-04 DIAGNOSIS — I5033 Acute on chronic diastolic (congestive) heart failure: Secondary | ICD-10-CM | POA: Diagnosis not present

## 2022-11-04 DIAGNOSIS — I10 Essential (primary) hypertension: Secondary | ICD-10-CM | POA: Diagnosis not present

## 2022-11-04 DIAGNOSIS — E7800 Pure hypercholesterolemia, unspecified: Secondary | ICD-10-CM | POA: Diagnosis not present

## 2022-11-04 DIAGNOSIS — N179 Acute kidney failure, unspecified: Secondary | ICD-10-CM

## 2022-11-04 DIAGNOSIS — I4819 Other persistent atrial fibrillation: Secondary | ICD-10-CM | POA: Diagnosis not present

## 2022-11-04 LAB — BASIC METABOLIC PANEL
Anion gap: 10 (ref 5–15)
BUN: 34 mg/dL — ABNORMAL HIGH (ref 8–23)
CO2: 31 mmol/L (ref 22–32)
Calcium: 8.6 mg/dL — ABNORMAL LOW (ref 8.9–10.3)
Chloride: 93 mmol/L — ABNORMAL LOW (ref 98–111)
Creatinine, Ser: 1.56 mg/dL — ABNORMAL HIGH (ref 0.61–1.24)
GFR, Estimated: 42 mL/min — ABNORMAL LOW (ref >60)
Glucose, Bld: 108 mg/dL — ABNORMAL HIGH (ref 70–99)
Potassium: 3.5 mmol/L (ref 3.5–5.1)
Sodium: 134 mmol/L — ABNORMAL LOW (ref 135–145)

## 2022-11-04 LAB — MAGNESIUM: Magnesium: 1.8 mg/dL (ref 1.7–2.4)

## 2022-11-04 MED ORDER — RIVAROXABAN 15 MG PO TABS
15.0000 mg | ORAL_TABLET | Freq: Every day | ORAL | Status: DC
  Administered 2022-11-04: 15 mg via ORAL
  Filled 2022-11-04: qty 1

## 2022-11-04 MED ORDER — MAGNESIUM SULFATE 2 GM/50ML IV SOLN
2.0000 g | Freq: Once | INTRAVENOUS | Status: AC
  Administered 2022-11-04: 2 g via INTRAVENOUS
  Filled 2022-11-04: qty 50

## 2022-11-04 NOTE — Progress Notes (Addendum)
  Progress Note   Patient: Sean Lindsey JJO:[id number] DOB: [birth year] DOA: 11/01/2022     2 DOS: the patient was seen and examined on 11/04/2022   Brief hospital course: WILLMAN CUNY was admitted to the hospital with the working diagnosis of heart failure exacerbation.    89 y.o. male with medical history significant for persistent atrial fibrillation on Xarelto, HFpEF (EF 60% 08/25/2022), HTN, HLD, anxiety, BPH who presented with scrotal edema. Reported progressive lower extremity edema, progressed to penile and scrotal edema, prompting to come to the ED. Om his initial physical examination his blood pressure is 156/114, HR 119, RR 18, 02 sat 100%. Lungs with no wheezing, or rales, heart with S1 and S2 present, irregularly irregular, abdomen with no distention, positive lower extremity edema.   Assessment and Plan: * Persistent atrial fibrillation with rapid ventricular response (HCC) Patient required transitory IV diltiazem Rate control atrial fibrillation with metoprolol, continue rivaroxaban for anticoagulation, reduced dose due to low GFR.    Acute on chronic heart failure with preserved ejection fraction (HFpEF) ([place]) 08/2022 echocardiogram with preserved LV systolic function with EF 60%, with mild concentric hypertrophy.  Mild to moderate TR. RVSP 37   Urine output is not documented, but his weight has decreased from 80 kg on admission to 75 kg.  Systolic blood pressure 91 to 122  Volume status is improving.   Plan to hold on furosemide today due to rise in serum cr.  Add unna boots to lower extremities.  Continue with metoprolol and spironolactone No SGLT2 inh due to scrotal edema.   Primary hypertension Drop in blood pressure.  Hold on furosemide for now.   Pure hypercholesterolemia Continue atorvastatin.  Anxiety Continue home Xanax 0.25 mg daily as needed.  BPH (benign prostatic hyperplasia) Continue Proscar and Rapaflo.  AKI (acute kidney injury)  ([place]) Hypomagnesemia. Hypokalemia  Renal function with serum cr at 1,56 with K at 3,5 and serum bicarbonate at 31. Plan to add IV mag and hold furosemide for today.      Subjective: Patient is feeling better, no orthopnea or dyspnea, lower extremity edema is improving but not back to baseline   Physical Exam: Vitals:   11/03/22 2040 11/04/22 0454 11/04/22 0457 11/04/22 0726  BP: 91/68  114/83 122/83  Pulse: 86  84   Resp: 18  18   Temp: 97.6 F (36.4 C)  97.8 F (36.6 C) 97.8 F (36.6 C)  TempSrc: Oral  Oral Oral  SpO2: 97%  97%   Weight:  75.2 kg    Height:       Neurology awake and alert ENT with mild pallor Cardiovascular with S1 and S2 present, irregularly irregular with no gallops, rubs or murmurs Respiratory with no rales or wheezing' Abdomen with no distention Positive lower extremity edema ++  Data Reviewed:    Family Communication: no family at the bedside.  I spoke with patient's daughter over the phone  we talked in detail about patient's condition, plan of care and prognosis and all questions were addressed.   Disposition: Status is: Inpatient Remains inpatient appropriate because: follow up renal function   Planned Discharge Destination: Home      Author: Tawni Millers, MD 11/04/2022 10:52 AM  For on call review [email or web address].

## 2022-11-04 NOTE — Progress Notes (Signed)
Patient stated he was okay and he didn't want to wash up

## 2022-11-04 NOTE — Evaluation (Signed)
Occupational Therapy Evaluation/Discharge Patient Details Name: Sean Lindsey MRN: [id number] DOB: Oct 14, 1929 Today's Date: 11/04/2022   History of Present Illness Patient is a 89 y/o male who presents on 1/22 with scrotal edema. Admitted with A-fib with RVR and acute on chronic HF. PMH includes A-fib, HTN, anxiety.   Clinical Impression   PTA, pt lives with spouse, typically Independent in all daily tasks including assisting wife at times. Pt presents now at baseline for ADLs/mobility, received after mobilizing in hallway with PT. Pt able to demo acceptance of various balance challenges to simulate ADL/IADL skills without issue. Pt reports managing ADLs Independently in room during this admission. No further skilled OT services needed at acute level or on DC. OT to sign off.       Recommendations for follow up therapy are one component of a multi-disciplinary discharge planning process, led by the attending physician.  Recommendations may be updated based on patient status, additional functional criteria and insurance authorization.   Follow Up Recommendations  No OT follow up     Assistance Recommended at Discharge PRN  Patient can return home with the following      Functional Status Assessment  Patient has not had a recent decline in their functional status  Equipment Recommendations  None recommended by OT    Recommendations for Other Services       Precautions / Restrictions Precautions Precautions: Fall Restrictions Weight Bearing Restrictions: No      Mobility Bed Mobility               General bed mobility comments: up in chair on entry    Transfers Overall transfer level: Modified independent Equipment used: Rolling walker (2 wheels)                      Balance Overall balance assessment: Needs assistance Sitting-balance support: Feet supported, No upper extremity supported Sitting balance-Leahy Scale: Good     Standing balance support:  During functional activity, Bilateral upper extremity supported Standing balance-Leahy Scale: Fair                             ADL either performed or assessed with clinical judgement   ADL Overall ADL's : Modified independent;At baseline                                       General ADL Comments: able to easily bend to feet to reach shoes (already had pants and shoes on, reports having them on since this AM), able to pick up item from floor easily, reach overhead to simulate reaching in cabinets without LOB. Pt son present, does endorse some concerns w/ pt assisting wife in/out of the home, to appts, etc. Educated on gait belt, fall precautions, ramp (though pt reports this may not work with home setup) and where son can assist as needed     Vision Baseline Vision/History: 1 Wears glasses Ability to See in Adequate Light: 0 Adequate Patient Visual Report: No change from baseline Vision Assessment?: No apparent visual deficits     Perception     Praxis      Pertinent Vitals/Pain Pain Assessment Pain Assessment: No/denies pain     Hand Dominance Left   Extremity/Trunk Assessment Upper Extremity Assessment Upper Extremity Assessment: Overall WFL for tasks assessed   Lower Extremity Assessment Lower Extremity  Assessment: Defer to PT evaluation   Cervical / Trunk Assessment Cervical / Trunk Assessment: Kyphotic   Communication Communication Communication: No difficulties   Cognition Arousal/Alertness: Awake/alert Behavior During Therapy: WFL for tasks assessed/performed Overall Cognitive Status: Within Functional Limits for tasks assessed                                       General Comments  Son entering during session    Exercises     Shoulder Instructions      Home Living Family/patient expects to be discharged to:: Private residence Living Arrangements: Spouse/significant other Available Help at Discharge:  Family;Available 24 hours/day Type of Home: House Home Access: Level entry     Home Layout: One level     Bathroom Shower/Tub: Occupational psychologist: Handicapped height     Home Equipment: Conservation officer, nature (2 wheels);Shower seat          Prior Functioning/Environment Prior Level of Function : Independent/Modified Independent             Mobility Comments: typically no AD but will use RW if walking a long distance ADLs Comments: Independent with ADLs, IADLs,driving and grocery shopping. reports caring for his wife, holding on to her while she is walking        OT Problem List:        OT Treatment/Interventions:      OT Goals(Current goals can be found in the care plan section) Acute Rehab OT Goals Patient Stated Goal: go home soon OT Goal Formulation: All assessment and education complete, DC therapy  OT Frequency:      Co-evaluation              AM-PAC OT "6 Clicks" Daily Activity     Outcome Measure Help from another person eating meals?: None Help from another person taking care of personal grooming?: None Help from another person toileting, which includes using toliet, bedpan, or urinal?: None Help from another person bathing (including washing, rinsing, drying)?: None Help from another person to put on and taking off regular upper body clothing?: None Help from another person to put on and taking off regular lower body clothing?: None 6 Click Score: 24   End of Session Equipment Utilized During Treatment: Rolling walker (2 wheels)  Activity Tolerance: Patient tolerated treatment well Patient left: in chair;with call bell/phone within reach;with family/visitor present  OT Visit Diagnosis: Other abnormalities of gait and mobility (R26.89)                Time: 1418-1430 OT Time Calculation (min): 12 min Charges:  OT General Charges $OT Visit: 1 Visit OT Evaluation $OT Eval Low Complexity: 1 Low  Malachy Chamber, OTR/L Acute Rehab  Services Office: [phone]   Layla Maw 11/04/2022, 2:34 PM

## 2022-11-04 NOTE — Progress Notes (Signed)
Physical Therapy Treatment Patient Details Name: Sean Lindsey MRN: [id number] DOB: 06/15/1930 Today's Date: 11/04/2022   History of Present Illness Patient is a 89 y/o male who presents on 1/22 with scrotal edema. Admitted with A-fib with RVR and acute on chronic HF. PMH includes A-fib, HTN, anxiety.    PT Comments    Pt was seen for follow up to evaluation, with pt demonstrating gait changes related to ROM on LE's.  Instruction was given to pt for simple stretches to manage the changes of DF and hip ext from disuse, and pt is also attended by son who could observe as well.  Follow along with him to progress his flexibility as tolerated, and to progress gait quality to increase safety and reduce fall risk.  Recommendations for follow up therapy are one component of a multi-disciplinary discharge planning process, led by the attending physician.  Recommendations may be updated based on patient status, additional functional criteria and insurance authorization.  Follow Up Recommendations  No PT follow up     Assistance Recommended at Discharge Intermittent Supervision/Assistance  Patient can return home with the following A little help with walking and/or transfers;A little help with bathing/dressing/bathroom;Assistance with cooking/housework   Equipment Recommendations  None recommended by PT    Recommendations for Other Services       Precautions / Restrictions Precautions Precautions: Fall Restrictions Weight Bearing Restrictions: No     Mobility  Bed Mobility               General bed mobility comments: up at chair    Transfers Overall transfer level: Modified independent Equipment used: None                    Ambulation/Gait Ambulation/Gait assistance: Supervision Gait Distance (Feet): 200 Feet Assistive device: Rolling walker (2 wheels) Gait Pattern/deviations: Step-through pattern, Decreased stride length Gait velocity: reduced Gait velocity  interpretation: <1.31 ft/sec, indicative of household ambulator Pre-gait activities: standing balance ck General Gait Details: pt is walking with low DF ROM and minimal hip extension, instructed pt on stretches to deal with the issues   Stairs             Wheelchair Mobility    Modified Rankin (Stroke Patients Only)       Balance Overall balance assessment: Needs assistance Sitting-balance support: Feet supported Sitting balance-Leahy Scale: Good     Standing balance support: Bilateral upper extremity supported, During functional activity Standing balance-Leahy Scale: Fair                              Cognition Arousal/Alertness: Awake/alert Behavior During Therapy: WFL for tasks assessed/performed Overall Cognitive Status: Within Functional Limits for tasks assessed                                          Exercises General Exercises - Lower Extremity Ankle Circles/Pumps: AROM, 5 reps Gluteal Sets: Standing, AROM, 10 reps Hip ABduction/ADduction: AROM, 10 reps    General Comments General comments (skin integrity, edema, etc.): son arrived in therapy session with no active conversation added to the session      Pertinent Vitals/Pain Pain Assessment Pain Assessment: No/denies pain    Home Living Family/patient expects to be discharged to:: Private residence Living Arrangements: Spouse/significant other Available Help at Discharge: Family;Available 24 hours/day Type of Home:  House Home Access: Level entry       Home Layout: One level Home Equipment: Conservation officer, nature (2 wheels);Shower seat      Prior Function            PT Goals (current goals can now be found in the care plan section) Acute Rehab PT Goals Patient Stated Goal: to go home Progress towards PT goals: Progressing toward goals    Frequency    Min 3X/week      PT Plan Current plan remains appropriate    Co-evaluation              AM-PAC PT  "6 Clicks" Mobility   Outcome Measure  Help needed turning from your back to your side while in a flat bed without using bedrails?: None Help needed moving from lying on your back to sitting on the side of a flat bed without using bedrails?: None Help needed moving to and from a bed to a chair (including a wheelchair)?: A Little Help needed standing up from a chair using your arms (e.g., wheelchair or bedside chair)?: A Little Help needed to walk in hospital room?: A Little Help needed climbing 3-5 steps with a railing? : A Little 6 Click Score: 20    End of Session   Activity Tolerance: Patient tolerated treatment well Patient left: in chair;with call bell/phone within reach;with family/visitor present Nurse Communication: Mobility status PT Visit Diagnosis: Difficulty in walking, not elsewhere classified (R26.2);Unsteadiness on feet (R26.81)     Time: 3383-2919 PT Time Calculation (min) (ACUTE ONLY): 12 min  Charges:  $Gait Training: 8-22 mins   Ramond Dial 11/04/2022, 3:25 PM  Mee Hives, PT PhD Acute Rehab Dept. Number: [place] [facility] [place]

## 2022-11-04 NOTE — Plan of Care (Signed)
  Problem: Clinical Measurements: Goal: Respiratory complications will improve Outcome: Progressing   Problem: Activity: Goal: Risk for activity intolerance will decrease Outcome: Progressing

## 2022-11-05 ENCOUNTER — Other Ambulatory Visit (HOSPITAL_COMMUNITY): Payer: Self-pay

## 2022-11-05 DIAGNOSIS — I4819 Other persistent atrial fibrillation: Secondary | ICD-10-CM | POA: Diagnosis not present

## 2022-11-05 DIAGNOSIS — I5033 Acute on chronic diastolic (congestive) heart failure: Secondary | ICD-10-CM | POA: Diagnosis not present

## 2022-11-05 DIAGNOSIS — I10 Essential (primary) hypertension: Secondary | ICD-10-CM | POA: Diagnosis not present

## 2022-11-05 DIAGNOSIS — E7800 Pure hypercholesterolemia, unspecified: Secondary | ICD-10-CM | POA: Diagnosis not present

## 2022-11-05 LAB — BASIC METABOLIC PANEL
Anion gap: 9 (ref 5–15)
BUN: 37 mg/dL — ABNORMAL HIGH (ref 8–23)
CO2: 33 mmol/L — ABNORMAL HIGH (ref 22–32)
Calcium: 8.7 mg/dL — ABNORMAL LOW (ref 8.9–10.3)
Chloride: 95 mmol/L — ABNORMAL LOW (ref 98–111)
Creatinine, Ser: 1.46 mg/dL — ABNORMAL HIGH (ref 0.61–1.24)
GFR, Estimated: 45 mL/min — ABNORMAL LOW (ref >60)
Glucose, Bld: 100 mg/dL — ABNORMAL HIGH (ref 70–99)
Potassium: 4.2 mmol/L (ref 3.5–5.1)
Sodium: 137 mmol/L (ref 135–145)

## 2022-11-05 LAB — MAGNESIUM: Magnesium: 2.1 mg/dL (ref 1.7–2.4)

## 2022-11-05 MED ORDER — FUROSEMIDE 40 MG PO TABS: 40.0000 mg | ORAL_TABLET | Freq: Every day | ORAL | Status: DC

## 2022-11-05 MED ORDER — METOPROLOL TARTRATE 50 MG PO TABS
50.0000 mg | ORAL_TABLET | Freq: Two times a day (BID) | ORAL | 0 refills | Status: DC
  Filled 2022-11-05: qty 60, 30d supply, fill #0

## 2022-11-05 MED ORDER — FUROSEMIDE 40 MG PO TABS
40.0000 mg | ORAL_TABLET | Freq: Every day | ORAL | 0 refills | Status: DC
  Filled 2022-11-05: qty 30, 30d supply, fill #0

## 2022-11-05 NOTE — Progress Notes (Signed)
Compression socks given and placed on patient prior to discharge.

## 2022-11-05 NOTE — Care Management Important Message (Signed)
Important Message  Patient Details  Name: Sean Lindsey MRN: [id number] Date of Birth: August 13, 1930   Medicare Important Message Given:  Yes     Shelda Altes 11/05/2022, 8:51 AM

## 2022-11-05 NOTE — TOC Transition Note (Addendum)
Transition of Care [facility]) - CM/SW Discharge Note   Patient Details  Name: Sean Lindsey MRN: [id number] Date of Birth: Mar 22, 1930  Transition of Care [facility]) CM/SW Contact:  Zenon Mayo, RN Phone Number: 11/05/2022, 9:00 AM   Clinical Narrative:    For dc today, has no needs. TOC to fill meds.         Patient Goals and CMS Choice      Discharge Placement                         Discharge Plan and Services Additional resources added to the After Visit Summary for                                       Social Determinants of Health (SDOH) Interventions SDOH Screenings   Food Insecurity: No Food Insecurity (11/02/2022)  Housing: Low Risk  (11/01/2022)  Transportation Needs: No Transportation Needs (11/02/2022)  Utilities: Not At Risk (11/02/2022)  Tobacco Use: Medium Risk (11/02/2022)     Readmission Risk Interventions     No data to display

## 2022-11-05 NOTE — Discharge Summary (Signed)
Physician Discharge Summary   Patient: Sean Lindsey MRN: [id number] DOB: 26-Sep-1930  Admit date:     11/01/2022  Discharge date: 11/05/22  Discharge Physician: Jimmy Picket Brandice Busser   PCP: Holland Commons, FNP   Recommendations at discharge:    Patient has been placed back on furosemide 40 mg po daily, to take in the morning and increase to bid in case of weight gain 2 to 3 lbs in 24 hrs 5 lbs in 7 days.  Resumed metoprolol 50 mg bid for atrial fibrillation rate control. Follow up renal function in 7 days.  Added ted hose for lower extremity edema. Follow up with Cardiology as scheduled.  Follow up with Holland Commons FNP in 7 to 10 days.   Discharge Diagnoses: Principal Problem:   Persistent atrial fibrillation with rapid ventricular response (HCC) Active Problems:   Acute on chronic heart failure with preserved ejection fraction (HFpEF) (HCC)   Primary hypertension   Pure hypercholesterolemia   Anxiety   BPH (benign prostatic hyperplasia)   AKI (acute kidney injury) ([place])  Resolved Problems:   * No resolved hospital problems. *  Hospital Course: Sean Lindsey was admitted to the hospital with the working diagnosis of heart failure exacerbation.    89 y.o. male with medical history significant for persistent atrial fibrillation on Xarelto, HFpEF (EF 60% 08/25/2022), HTN, HLD, anxiety, BPH who presented with scrotal edema. Reported progressive lower extremity edema, that progressed to penile and scrotal edema, prompting him to come to the ED. Apparently he has not been taking furosemide at home.  On his initial physical examination his blood pressure is 156/114, HR 119, RR 18, 02 sat 100%. Lungs with no wheezing, or rales, heart with S1 and S2 present, irregularly irregular, abdomen with no distention, positive lower extremity edema.   Na 138, K 3,8 Cl 101 bicarbonate 26 glucose 154 bun 23 cr 1,0 BNP 512,7  High sensitive troponin 33  Wbc 5,6 hgb 14,2 plt 203    Urine analysis with SG 1,010, negative protein, and negative leukocytes.   Chest radiograph with mild cardiomegaly, increase lung markings bilaterally with fluid in the right fissure and small right pleural effusion.   EKG 134 bpm, right axis deviation, qtc 508, right bundle branch block, atrial fibrillation, with no significant ST segment or T wave changes.   Patient was placed on IV furosemide for diuresis with improvement in his symptoms.    Assessment and Plan: * Persistent atrial fibrillation with rapid ventricular response (HCC) Patient required transitory IV diltiazem Rate control atrial fibrillation with metoprolol, continue rivaroxaban for anticoagulation.    Acute on chronic heart failure with preserved ejection fraction (HFpEF) ([place]) 08/2022 echocardiogram with preserved LV systolic function with EF 60%, with mild concentric hypertrophy.  Mild to moderate TR. RVSP 37   Patient was placed on IV furosemide for diuresis, negative fluid balance was achieved, losing 5 kg weight with significant improvement in her symptoms.  Patient had unna boots placed for peripheral edema with good toleration.  Continue with metoprolol and spironolactone. Continue diuresis with oral furosemide.  No SGLT2 inh due to scrotal edema.   Primary hypertension Blood pressure has been stable, continue with metoprolol along with spironolactone and furosemide.  Resume olmesartan HCTZ combination at the time of discharge.   Pure hypercholesterolemia Continue atorvastatin.  Anxiety Continue home Xanax 0.25 mg daily as needed.  BPH (benign prostatic hyperplasia) Continue Proscar and Rapaflo.  AKI (acute kidney injury) ([place]) Hypomagnesemia. Hypokalemia  At the time  of his discharge his renal function is improving, with a serum cr at 1,46 K is 4,2 and serum bicarbonate 33.  Plan to resume diuretic therapy with furosemide and spironolactone.  Follow up renal function as outpatient in 7 days.           Consultants: none  Procedures performed: none   Disposition: Home Diet recommendation:  Discharge Diet Orders (From admission, onward)     Start     Ordered   11/05/22 0000  Diet - low sodium heart healthy        11/05/22 0859           Cardiac diet DISCHARGE MEDICATION: Allergies as of 11/05/2022       Reactions   Flomax [tamsulosin Hcl] Other (See Comments)   Fainting   Uroxatral [alfuzosin] Other (See Comments)   Made patient feel bad.        Medication List     STOP taking these medications    potassium chloride 10 MEQ tablet Commonly known as: KLOR-CON       TAKE these medications    ALPRAZolam 0.25 MG tablet Commonly known as: XANAX Take 0.25 mg by mouth daily as needed for anxiety.   atorvastatin 20 MG tablet Commonly known as: LIPITOR Take 20 mg by mouth daily.   Benicar HCT 40-25 MG tablet Generic drug: olmesartan-hydrochlorothiazide Take 1 tablet by mouth daily.   finasteride 5 MG tablet Commonly known as: PROSCAR Take 5 mg by mouth daily.   furosemide 40 MG tablet Commonly known as: LASIX Take 1 tablet (40 mg total) by mouth daily. Take one tablet in the morning. What changed:  when to take this additional instructions   melatonin 3 MG Tabs tablet Take 1 tablet by mouth at bedtime as needed (sleep).   metoprolol tartrate 50 MG tablet Commonly known as: LOPRESSOR Take 1 tablet (50 mg total) by mouth 2 (two) times daily.   MULTIVITAMIN PO Take 1 tablet by mouth daily.   Rapaflo 8 MG Caps capsule Generic drug: silodosin Take 8 mg by mouth daily with breakfast.   spironolactone 25 MG tablet Commonly known as: ALDACTONE TAKE ONE TABLET EVERY MORNING What changed: when to take this   Xarelto 20 MG Tabs tablet Generic drug: rivaroxaban TAKE 1 TABLET DAILY WITH SUPPER What changed: See the new instructions.        Discharge Exam: Filed Weights   11/03/22 0430 11/04/22 0454 11/05/22 0132  Weight: 76.8 kg  75.2 kg 75.1 kg   BP 133/79 (BP Location: Left Arm)   Pulse 87   Temp 97.6 F (36.4 C) (Oral)   Resp 18   Ht '5\' 11"'$  (1.803 m)   Wt 75.1 kg   SpO2 99%   BMI 23.10 kg/m    Patient is feeling better, dyspnea has resolved and edema continue to improve.  Neurology awake and alert ENT with no pallor Cardiovascular with S1 and S2 present, irregularly irregular with no gallops, rubs or murmurs Respiratory with no rales or wheezing Abdomen with no distention Trace lower extremity edema   Condition at discharge: stable  The results of significant diagnostics from this hospitalization (including imaging, microbiology, ancillary and laboratory) are listed below for reference.   Imaging Studies: DG Chest Port 1 View  Result Date: 11/01/2022 CLINICAL DATA:  Edema EXAM: PORTABLE CHEST 1 VIEW COMPARISON:  Radiograph and CT 12/11/2018 FINDINGS: Unchanged cardiomediastinal silhouette. There are small bilateral pleural effusions, right greater than left with adjacent basilar opacities. Moderate central  opacities. Skin fold overlies the right chest. No evidence of pneumothorax. Bilateral shoulder degenerative changes. Thoracic spondylosis. IMPRESSION: Small bilateral pleural effusions with adjacent basilar atelectasis. Interstitial opacities which could reflect a degree of interstitial edema Electronically Signed   By: Maurine Simmering M.D.   On: 11/01/2022 12:10    Microbiology: Results for orders placed or performed in visit on 02/17/14  Culture, Group A Strep     Status: None   Collection Time: 02/17/14  3:08 PM   Specimen: Throat  Result Value Ref Range Status   Organism ID, Bacteria Normal Upper Respiratory Flora  Final   Organism ID, Bacteria No Beta Hemolytic Streptococci Isolated  Final    Labs: CBC: Recent Labs  Lab 11/01/22 1155 11/02/22 0131 11/03/22 0120  WBC 5.6 6.8 6.7  NEUTROABS 3.5  --   --   HGB 14.2 14.6 14.7  HCT 43.7 42.6 42.6  MCV 101.2* 91.4 91.6  PLT 203 211 500    Basic Metabolic Panel: Recent Labs  Lab 11/01/22 1155 11/02/22 0131 11/03/22 0120 11/04/22 0131 11/05/22 0129  NA 138 138 136 134* 137  K 3.8 3.1* 3.4* 3.5 4.2  CL 101 100 98 93* 95*  CO2 '26 27 28 31 '$ 33*  GLUCOSE 154* 118* 109* 108* 100*  BUN '23 18 23 '$ 34* 37*  CREATININE 1.02 0.98 1.23 1.56* 1.46*  CALCIUM 9.1 8.6* 8.7* 8.6* 8.7*  MG  --  1.6*  --  1.8 2.1   Liver Function Tests: No results for input(s): "AST", "ALT", "ALKPHOS", "BILITOT", "PROT", "ALBUMIN" in the last 168 hours. CBG: No results for input(s): "GLUCAP" in the last 168 hours.  Discharge time spent: greater than 30 minutes.  Signed: Tawni Millers, MD [facility] 11/05/2022

## 2022-12-21 DIAGNOSIS — Z23 Encounter for immunization: Secondary | ICD-10-CM | POA: Diagnosis not present

## 2023-01-26 DIAGNOSIS — E7801 Familial hypercholesterolemia: Secondary | ICD-10-CM | POA: Diagnosis not present

## 2023-01-26 DIAGNOSIS — I4891 Unspecified atrial fibrillation: Secondary | ICD-10-CM | POA: Diagnosis not present

## 2023-01-26 DIAGNOSIS — E7800 Pure hypercholesterolemia, unspecified: Secondary | ICD-10-CM | POA: Diagnosis not present

## 2023-01-26 DIAGNOSIS — I10 Essential (primary) hypertension: Secondary | ICD-10-CM | POA: Diagnosis not present

## 2023-01-26 DIAGNOSIS — R7303 Prediabetes: Secondary | ICD-10-CM | POA: Diagnosis not present

## 2023-02-15 ENCOUNTER — Ambulatory Visit: Payer: Medicare Other | Admitting: Internal Medicine

## 2023-02-15 ENCOUNTER — Encounter: Payer: Self-pay | Admitting: Internal Medicine

## 2023-02-15 VITALS — BP 130/66 | HR 82 | Ht 71.0 in | Wt 179.0 lb

## 2023-02-15 DIAGNOSIS — E782 Mixed hyperlipidemia: Secondary | ICD-10-CM

## 2023-02-15 DIAGNOSIS — I4811 Longstanding persistent atrial fibrillation: Secondary | ICD-10-CM | POA: Diagnosis not present

## 2023-02-15 DIAGNOSIS — I10 Essential (primary) hypertension: Secondary | ICD-10-CM | POA: Diagnosis not present

## 2023-02-15 MED ORDER — METOPROLOL TARTRATE 50 MG PO TABS: 50.0000 mg | ORAL_TABLET | Freq: Two times a day (BID) | ORAL | 0 refills | Status: DC
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
## 2023-02-21 ENCOUNTER — Ambulatory Visit: Payer: Medicare Other | Admitting: Cardiology

## 2023-03-01 ENCOUNTER — Other Ambulatory Visit: Payer: Self-pay | Admitting: Cardiology

## 2023-03-21 ENCOUNTER — Other Ambulatory Visit: Payer: Self-pay | Admitting: Internal Medicine

## 2023-03-31 ENCOUNTER — Other Ambulatory Visit: Payer: Self-pay | Admitting: Cardiology

## 2023-04-29 ENCOUNTER — Other Ambulatory Visit: Payer: Self-pay | Admitting: Cardiology

## 2023-04-29 DIAGNOSIS — H353133 Nonexudative age-related macular degeneration, bilateral, advanced atrophic without subfoveal involvement: Secondary | ICD-10-CM | POA: Diagnosis not present

## 2023-04-29 DIAGNOSIS — H26491 Other secondary cataract, right eye: Secondary | ICD-10-CM | POA: Diagnosis not present

## 2023-06-02 ENCOUNTER — Other Ambulatory Visit: Payer: Self-pay | Admitting: Cardiology

## 2023-06-06 ENCOUNTER — Other Ambulatory Visit: Payer: Self-pay | Admitting: Cardiology

## 2023-07-01 ENCOUNTER — Other Ambulatory Visit: Payer: Self-pay | Admitting: Cardiology

## 2023-07-30 ENCOUNTER — Other Ambulatory Visit: Payer: Self-pay | Admitting: Cardiology

## 2023-08-03 ENCOUNTER — Telehealth: Payer: Self-pay

## 2023-08-03 DIAGNOSIS — R634 Abnormal weight loss: Secondary | ICD-10-CM | POA: Diagnosis not present

## 2023-08-03 DIAGNOSIS — I10 Essential (primary) hypertension: Secondary | ICD-10-CM | POA: Diagnosis not present

## 2023-08-03 DIAGNOSIS — E7800 Pure hypercholesterolemia, unspecified: Secondary | ICD-10-CM | POA: Diagnosis not present

## 2023-08-03 DIAGNOSIS — Z23 Encounter for immunization: Secondary | ICD-10-CM | POA: Diagnosis not present

## 2023-08-03 DIAGNOSIS — Z0000 Encounter for general adult medical examination without abnormal findings: Secondary | ICD-10-CM | POA: Diagnosis not present

## 2023-08-03 DIAGNOSIS — I4891 Unspecified atrial fibrillation: Secondary | ICD-10-CM | POA: Diagnosis not present

## 2023-08-03 DIAGNOSIS — R7303 Prediabetes: Secondary | ICD-10-CM | POA: Diagnosis not present

## 2023-08-03 NOTE — Patient Outreach (Signed)
  Care Coordination   In Person Provider Office Visit Note   08/03/2023 Name: JAWARA SCHUCHART MRN: [id number] DOB: [birth year]  CERONE HISLE is a 89 y.o. year old male who sees Larose Hires, Gaylyn Lambert, FNP for primary care. I engaged with Wallace Cullens in the providers office today.  What matters to the patients health and wellness today?  none    Goals Addressed             This Visit's Progress    COMPLETED: Care Coordination Activities-No follow up required       Care Coordination Interventions: Advised patient to Annual Wellness exam. Discussed services and support. Assessed SDOH. Advised to discuss with primary care physician if services needed in the future.        SDOH assessments and interventions completed:  Yes  SDOH Interventions Today    Flowsheet Row Most Recent Value  SDOH Interventions   Transportation Interventions Intervention Not Indicated  Health Literacy Interventions Intervention Not Indicated        Care Coordination Interventions:  Yes, provided   Follow up plan: No further intervention required.   Encounter Outcome:  Patient Visit Completed   Bary Leriche, RN, MSN [facility]  [facility], [facility] Management Community Coordinator Direct Dial: [phone]  Fax: [phone] Website: [email or web address]

## 2023-08-03 NOTE — Patient Instructions (Signed)
Visit Information  Thank you for taking time to visit with me today. Please don't hesitate to contact me if I can be of assistance to you.   Following are the goals we discussed today:   Goals Addressed             This Visit's Progress    COMPLETED: Care Coordination Activities-No follow up required       Care Coordination Interventions: Advised patient to Annual Wellness exam. Discussed services and support. Assessed SDOH. Advised to discuss with primary care physician if services needed in the future.         If you are experiencing a Mental Health or Behavioral Health Crisis or need someone to talk to, please call the Suicide and Crisis Lifeline: 988   The patient verbalized understanding of instructions, educational materials, and care plan provided today and DECLINED offer to receive copy of patient instructions, educational materials, and care plan.   The patient has been provided with contact information for the care management team and has been advised to call with any health related questions or concerns.   Bary Leriche, RN, MSN [facility], [facility] Management Community Coordinator Direct Dial: [phone]  Fax: [phone] Website: [email or web address]

## 2023-08-17 ENCOUNTER — Ambulatory Visit: Payer: Self-pay | Admitting: Cardiology

## 2023-08-31 DIAGNOSIS — Z23 Encounter for immunization: Secondary | ICD-10-CM | POA: Diagnosis not present

## 2023-09-08 ENCOUNTER — Other Ambulatory Visit: Payer: Self-pay | Admitting: Cardiology

## 2023-09-12 ENCOUNTER — Telehealth: Payer: Self-pay

## 2023-09-12 NOTE — Telephone Encounter (Signed)
done

## 2023-09-12 NOTE — Telephone Encounter (Signed)
Last SCr was 11 months ago.  Let's give him a month at 20 mg and have him repeat the labs ASAP.  I suspect he'll need the lower dose

## 2023-09-12 NOTE — Telephone Encounter (Signed)
Prescription refill request for Xarelto received.  Indication:afib Last office visit:5/24 Weight:81.2  kg Age:89 Scr:1.46  1/24 CrCl:37.85  ml/min  Under review

## 2023-09-20 ENCOUNTER — Other Ambulatory Visit: Payer: Self-pay | Admitting: Cardiology

## 2023-10-04 ENCOUNTER — Other Ambulatory Visit: Payer: Self-pay | Admitting: Cardiology

## 2023-10-05 ENCOUNTER — Other Ambulatory Visit: Payer: Self-pay | Admitting: Cardiology

## 2023-10-05 DIAGNOSIS — I4811 Longstanding persistent atrial fibrillation: Secondary | ICD-10-CM

## 2023-10-06 NOTE — Telephone Encounter (Signed)
Prescription refill request for Xarelto received.  Indication: a fib Last office visit: 02/15/23 Weight: 179 Age: 89 Scr: 1.46 kpn 11/05/22 CrCl: 38 ml/min

## 2023-12-09 ENCOUNTER — Encounter: Payer: Self-pay | Admitting: Cardiology

## 2023-12-30 ENCOUNTER — Ambulatory Visit: Payer: Medicare Other | Admitting: Cardiology

## 2024-01-25 ENCOUNTER — Other Ambulatory Visit: Payer: Self-pay | Admitting: Cardiology

## 2024-01-25 DIAGNOSIS — R739 Hyperglycemia, unspecified: Secondary | ICD-10-CM | POA: Diagnosis not present

## 2024-01-25 DIAGNOSIS — Z0000 Encounter for general adult medical examination without abnormal findings: Secondary | ICD-10-CM | POA: Diagnosis not present

## 2024-01-25 DIAGNOSIS — E7800 Pure hypercholesterolemia, unspecified: Secondary | ICD-10-CM | POA: Diagnosis not present

## 2024-01-25 DIAGNOSIS — I4811 Longstanding persistent atrial fibrillation: Secondary | ICD-10-CM

## 2024-01-25 DIAGNOSIS — I10 Essential (primary) hypertension: Secondary | ICD-10-CM | POA: Diagnosis not present

## 2024-01-25 LAB — LAB REPORT - SCANNED
A1c: 6.4
EGFR: 52

## 2024-01-26 NOTE — Telephone Encounter (Signed)
 Pt is past due for follow up and cancelled alst appt.  Message sent to schedulers to reschedule appt.  Refill denied.

## 2024-01-30 ENCOUNTER — Other Ambulatory Visit: Payer: Self-pay | Admitting: Cardiology

## 2024-02-07 DIAGNOSIS — I10 Essential (primary) hypertension: Secondary | ICD-10-CM | POA: Diagnosis not present

## 2024-02-07 DIAGNOSIS — R7303 Prediabetes: Secondary | ICD-10-CM | POA: Diagnosis not present

## 2024-02-07 DIAGNOSIS — I4891 Unspecified atrial fibrillation: Secondary | ICD-10-CM | POA: Diagnosis not present

## 2024-02-07 DIAGNOSIS — Z7901 Long term (current) use of anticoagulants: Secondary | ICD-10-CM | POA: Diagnosis not present

## 2024-02-07 DIAGNOSIS — E7800 Pure hypercholesterolemia, unspecified: Secondary | ICD-10-CM | POA: Diagnosis not present

## 2024-03-09 ENCOUNTER — Other Ambulatory Visit: Payer: Self-pay | Admitting: Cardiology

## 2024-04-07 ENCOUNTER — Other Ambulatory Visit: Payer: Self-pay | Admitting: Cardiology

## 2024-04-30 ENCOUNTER — Other Ambulatory Visit: Payer: Self-pay | Admitting: Cardiology

## 2024-05-06 ENCOUNTER — Other Ambulatory Visit: Payer: Self-pay | Admitting: Cardiology

## 2024-08-01 DIAGNOSIS — D649 Anemia, unspecified: Secondary | ICD-10-CM | POA: Diagnosis not present

## 2024-08-01 DIAGNOSIS — R739 Hyperglycemia, unspecified: Secondary | ICD-10-CM | POA: Diagnosis not present

## 2024-08-01 DIAGNOSIS — R7303 Prediabetes: Secondary | ICD-10-CM | POA: Diagnosis not present

## 2024-08-01 DIAGNOSIS — Z0000 Encounter for general adult medical examination without abnormal findings: Secondary | ICD-10-CM | POA: Diagnosis not present

## 2024-08-01 DIAGNOSIS — E7800 Pure hypercholesterolemia, unspecified: Secondary | ICD-10-CM | POA: Diagnosis not present

## 2024-08-01 LAB — LAB REPORT - SCANNED
A1c: 6.5
EGFR: 44
TSH: 3.21 (ref 0.41–5.90)

## 2024-08-21 DIAGNOSIS — Z7901 Long term (current) use of anticoagulants: Secondary | ICD-10-CM | POA: Diagnosis not present

## 2024-08-21 DIAGNOSIS — Z0000 Encounter for general adult medical examination without abnormal findings: Secondary | ICD-10-CM | POA: Diagnosis not present

## 2024-08-21 DIAGNOSIS — I4891 Unspecified atrial fibrillation: Secondary | ICD-10-CM | POA: Diagnosis not present

## 2024-08-21 DIAGNOSIS — Z23 Encounter for immunization: Secondary | ICD-10-CM | POA: Diagnosis not present

## 2024-08-21 DIAGNOSIS — I10 Essential (primary) hypertension: Secondary | ICD-10-CM | POA: Diagnosis not present

## 2024-08-21 DIAGNOSIS — H6123 Impacted cerumen, bilateral: Secondary | ICD-10-CM | POA: Diagnosis not present

## 2024-08-21 DIAGNOSIS — E876 Hypokalemia: Secondary | ICD-10-CM | POA: Diagnosis not present

## 2024-08-21 DIAGNOSIS — E7800 Pure hypercholesterolemia, unspecified: Secondary | ICD-10-CM | POA: Diagnosis not present

## 2024-08-21 DIAGNOSIS — R7303 Prediabetes: Secondary | ICD-10-CM | POA: Diagnosis not present
